# Patient Record
Sex: Female | Born: 1980 | Race: White | Hispanic: No | Marital: Married | Smoking: Former smoker
Health system: Southern US, Community
[De-identification: ages and names within clinical notes are randomized; demographics above are authoritative.]

## PROBLEM LIST (undated history)

## (undated) DIAGNOSIS — N941 Unspecified dyspareunia: Secondary | ICD-10-CM

## (undated) DIAGNOSIS — Z87410 Personal history of cervical dysplasia: Secondary | ICD-10-CM

## (undated) DIAGNOSIS — Z86018 Personal history of other benign neoplasm: Secondary | ICD-10-CM

## (undated) DIAGNOSIS — D649 Anemia, unspecified: Secondary | ICD-10-CM

## (undated) DIAGNOSIS — Z862 Personal history of diseases of the blood and blood-forming organs and certain disorders involving the immune mechanism: Secondary | ICD-10-CM

## (undated) DIAGNOSIS — K429 Umbilical hernia without obstruction or gangrene: Secondary | ICD-10-CM

## (undated) DIAGNOSIS — R102 Pelvic and perineal pain: Secondary | ICD-10-CM

## (undated) DIAGNOSIS — Z8632 Personal history of gestational diabetes: Secondary | ICD-10-CM

## (undated) DIAGNOSIS — G5603 Carpal tunnel syndrome, bilateral upper limbs: Secondary | ICD-10-CM

## (undated) DIAGNOSIS — F909 Attention-deficit hyperactivity disorder, unspecified type: Secondary | ICD-10-CM

## (undated) DIAGNOSIS — M1990 Unspecified osteoarthritis, unspecified site: Secondary | ICD-10-CM

## (undated) HISTORY — PX: WISDOM TOOTH EXTRACTION: SHX21

## (undated) HISTORY — PX: CERVICAL CONE BIOPSY: SUR198

## (undated) HISTORY — PX: ABDOMINAL HYSTERECTOMY: SHX81

## (undated) HISTORY — PX: CERVICAL BIOPSY  W/ LOOP ELECTRODE EXCISION: SUR135

---
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
## 2002-06-23 ENCOUNTER — Other Ambulatory Visit: Admission: RE | Admit: 2002-06-23 | Discharge: 2002-06-23 | Payer: Self-pay | Admitting: Obstetrics and Gynecology

## 2003-06-29 ENCOUNTER — Other Ambulatory Visit: Admission: RE | Admit: 2003-06-29 | Discharge: 2003-06-29 | Payer: Self-pay | Admitting: Obstetrics and Gynecology

## 2004-07-27 ENCOUNTER — Other Ambulatory Visit: Admission: RE | Admit: 2004-07-27 | Discharge: 2004-07-27 | Payer: Self-pay | Admitting: Obstetrics and Gynecology

## 2004-12-14 ENCOUNTER — Other Ambulatory Visit: Admission: RE | Admit: 2004-12-14 | Discharge: 2004-12-14 | Payer: Self-pay | Admitting: Obstetrics and Gynecology

## 2005-05-01 ENCOUNTER — Other Ambulatory Visit: Admission: RE | Admit: 2005-05-01 | Discharge: 2005-05-01 | Payer: Self-pay | Admitting: Obstetrics and Gynecology

## 2005-05-02 ENCOUNTER — Other Ambulatory Visit: Admission: RE | Admit: 2005-05-02 | Discharge: 2005-05-02 | Payer: Self-pay | Admitting: Obstetrics and Gynecology

## 2006-08-07 ENCOUNTER — Ambulatory Visit (HOSPITAL_COMMUNITY): Admission: RE | Admit: 2006-08-07 | Discharge: 2006-08-07 | Payer: Self-pay | Admitting: Obstetrics & Gynecology

## 2006-09-03 ENCOUNTER — Encounter: Admission: RE | Admit: 2006-09-03 | Discharge: 2006-09-03 | Payer: Self-pay | Admitting: Obstetrics & Gynecology

## 2006-11-01 ENCOUNTER — Inpatient Hospital Stay (HOSPITAL_COMMUNITY): Admission: AD | Admit: 2006-11-01 | Discharge: 2006-11-05 | Payer: Self-pay | Admitting: Obstetrics and Gynecology

## 2006-11-02 ENCOUNTER — Encounter (INDEPENDENT_AMBULATORY_CARE_PROVIDER_SITE_OTHER): Payer: Self-pay | Admitting: Obstetrics and Gynecology

## 2010-07-20 ENCOUNTER — Ambulatory Visit: Payer: Self-pay | Admitting: Internal Medicine

## 2010-10-24 NOTE — Op Note (Signed)
Latoya Rivers, Latoya Rivers          ACCOUNT NO.:  [id number]   MEDICAL RECORD NO.:  [id number]          PATIENT TYPE:  INP   LOCATION:  9123                          FACILITY:  [facility]   PHYSICIAN:  Juluis Mire, M.D.   DATE OF BIRTH:  10-13-1980   DATE OF PROCEDURE:  11/02/2006  DATE OF DISCHARGE:                               OPERATIVE REPORT   PREOPERATIVE DIAGNOSES:  1. Uterine pregnancy at term with oligohydramnios.  2. Gestational diabetes.  3. Failure to progress.   POSTOP DIAGNOSES:  1. Uterine pregnancy at term with oligohydramnios.  2. Gestational diabetes.  3. Failure to progress.  4. Uterine fibroid.   OPERATIVE PROCEDURE:  Low transverse cesarean section.   SURGEON:  Juluis Mire, M.D.   ANESTHESIA:  Epidural.   ESTIMATED BLOOD LOSS:  400-500 mL.   PACKS AND DRAINS:  None.   INTRAOPERATIVE BLOOD PLACEMENT AND COMPLICATIONS:  None.   INDICATIONS AS FOLLOWS:  The patient is a 30 year old gravida 2, para 0  female at 39+ weeks presenting to the office on Friday where ultrasound  revealed oligohydramnios.  Estimated fetal weight was 8 pounds 2 ounces.  Pregnancy complicated by gestational diabetes.  Brought in for induction  of labor; was Cytoteced on Friday.  Began Pitocin on Saturday.  Had  spontaneous rupture of membranes with clear fluid.  Progressed to 4-5 cm  of dilatation with arrest with over 4 hours despite adequate uterine  activity as documented by an intrauterine pressure catheter.  In view of  this decision was made to proceed with primary cesarean section.  The  risks were explained, including the risk of infection.  Risk of  hemorrhage that could require transfusion with the risk of AIDS or  hepatitis.  Risk of injury to adjacent organs including bladder, bowel,  or ureters which could require further exploratory surgery.  Risk of  deep venous thrombosis and pulmonary emboli.  The patient expressed  understanding of indications and risks.   PROCEDURE AS FOLLOW:  Patient was taken to the OR and placed in the  supine position with left lateral tilt.  After a satisfactory level of  epidural anesthesia was obtained, the abdomen was prepped out with  Betadine and draped in a sterile field.  A lower transverse skin  incision made with the knife and carried though the subcutaneous tissue.  The anterior rectus fascia entered sharply and incision into the fascia  entered laterally.  The fascia taken off the muscle superiorly and  inferiorly.  The rectus muscles were separated in the midline.  Anterior  perineum was entered sharply.  Incision into the peritoneum was extended  both superiorly and inferiorly.  A low transverse bladder flap was  developed.  A low transverse uterine incision begun with the knife and  extended laterally using Mayo traction.  The infant was in the vertex  presentation; and was delivered with fundal pressure, and the use of the  vacuum extractor.  Infant was a viable female who weighed 7 pounds 7  ounces and Apgars were 9/9.  Umbilical artery pH was 7.31.   Placenta was delivered manually; and sent  for pathological review.  Uterus was then exteriorized.  She had a large fundal fibroid on the  left side measuring approximately 10 cm.  Uterus was closed with locking  suture of #0 chromic using a two-layer closure technique.  We had good  hemostasis and clear urine output.  Both tubes, and ovaries were  unremarkable.  Uterus was then returned to the abdominal cavity.  We  thoroughly irrigated the pelvis and had excellent hemostasis with clear  urine output.  Muscles and peritoneum were closed with a running suture  of 3-0 Vicryl.  Fascia closed with a running suture of #0 PDS.  Skin was  closed with staples and Steri-Strips.  Sponge, instrument, and needle  count was reported as correct by circulating nurse x2.  Foley catheter  remained clear at time of closure.  The patient tolerated the procedure  well; and  returned to recovery room in good condition.      Juluis Mire, M.D.  Electronically Signed     JSM/MEDQ  D:  11/02/2006  T:  11/03/2006  Job:  [id number]

## 2010-10-24 NOTE — H&P (Signed)
Latoya Rivers, Latoya Rivers NO.:  [id number]   MEDICAL RECORD NO.:  [id number]          PATIENT TYPE:  INP   LOCATION:  9164                          [facility]ACILITY:  [facility]   PHYSICIAN:  Juluis Mire, M.D.   DATE OF BIRTH:  12-10-1980   DATE OF ADMISSION:  11/01/2006  DATE OF DISCHARGE:                              HISTORY & PHYSICAL   The patient is a 30 year old gravida 2, para 0, abortus 1 female, last  menstrual period of August 18 and estimated date of confinement of May  24 and estimated gestational age of 39-6/7 weeks.  Her prenatal course  has been complicated by gestational diabetes, controlled with diet.  An  ultrasound was performed today which revealed amniotic fluid index of 7  cm which is the 50th percentile.  Estimated fetal weight was 8 pounds 2  ounces.  Cervix is long and closed, but in view of the oligohydramnios,  the patient will be admitted now for induction of labor.   In terms of allergies, No known drug allergies.   MEDICATION:  Include prenatal vitamins.   For past medical history, family history and social history, please see  prenatal records.   REVIEW OF SYSTEMS:  Noncontributory.   PHYSICAL EXAMINATION:  The patient is afebrile with stable vital signs.  LUNGS:  Are clear.  CARDIOVASCULAR SYSTEM:  Rhythm and rate.  Grade 2/6 systolic ejection  murmur.  No clicks or gallops.  ABDOMINAL EXAM:  Gravid uterus consistent with dates.  PELVIC EXAM:  The cervix is long and closed, vertex presenting.  EXTREMITIES:  Trace edema.  NEUROLOGIC EXAM:  Grossly within normal limits.   IMPRESSION:  1. Intrauterine pregnancy at 39+ weeks with oligohydramnios.  2. Gestational diabetes.   PLAN:  The patient brought in for Cytotec ripening of the cervix.  She  is group B strep negative.  We have discussed induction of labor as well  as the indications.      Juluis Mire, M.D.  Electronically Signed     JSM/MEDQ  D:  11/01/2006  T:  11/01/2006   Job:  [id number]

## 2010-10-26 ENCOUNTER — Other Ambulatory Visit: Payer: Self-pay | Admitting: Obstetrics & Gynecology

## 2010-10-27 NOTE — Discharge Summary (Signed)
Latoya Rivers, Latoya Rivers          ACCOUNT NO.:  [id number]   MEDICAL RECORD NO.:  [id number]          PATIENT TYPE:  INP   LOCATION:  9123                          [facility]ACILITY:  [facility]   PHYSICIAN:  Guy Sandifer. Henderson Cloud, M.D. DATE OF BIRTH:  [birth year]   DATE OF ADMISSION:  11/01/2006  DATE OF DISCHARGE:  11/05/2006                               DISCHARGE SUMMARY   ADMISSION DIAGNOSES:  1. Intrauterine pregnancy at 39+ weeks estimated gestational age.  2. Induction of labor secondary to oligohydramnios and gestational      diabetes.   DISCHARGE DIAGNOSIS:  Status post low transverse cesarean section  secondary to failure to progress, a viable female infant.   PROCEDURE:  Primary low transverse cesarean section.   REASON FOR ADMISSION:  Please see dictated H&P.   HOSPITAL COURSE:  The patient is 30 year old gravida 2, para 0 that was  admitted to [facility] at 39+ weeks estimated  gestational age for two-stage induction of labor.  The patient had been  seen in the office on day prior to admission with noted oligohydramnios.  Estimated fetal weight was 8 pounds 2 ounces.  Pregnancy had also been  complicated by gestational diabetes.  On admission, Cytotec was  administered for augmentation of her labor.  On following morning the  patient was administered Pitocin.  The patient subsequently did have  spontaneous rupture of membranes which revealed clear fluid.  She did  progress to 4-5 cm of dilatation.  However, she arrested labor over the  next 4 hours with no further change in cervix. Adequate uterine activity  was documented by intrauterine pressure catheter.  Decision was made  then to proceed with a primary low transverse cesarean section.  The  patient was then transferred the operating room where epidural was dosed  to an adequate surgical level.  Low transverse incision was made with  delivery of a viable female infant weighing 7 pounds 7 ounces Apgars of 9  at 1 minute  and 9 at 5 minutes.  The patient tolerated procedure well  and taken to the recovery room in stable condition.  On postoperative  day #1 the patient was without complaint.  Vital signs were stable.  She  is afebrile.  Abdomen soft with good return of bowel function.  Fundus  is firm and nontender.  Abdominal dressing was noted to be clean, dry  and intact.  Laboratory findings revealed hemoglobin of 13.3. On  postoperative day #2 the patient was without complaint.  Vital signs  remained stable.  She is afebrile.  Fundus firm and nontender.  Abdominal dressing had been removed revealing an incision that is clean,  dry and intact.  Postoperative day #3 the patient was without complaint.  Vital signs remained stable.  She is afebrile.  Abdomen soft.  Fundus  firm and nontender.  Incision was clean, dry and intact.  Staples  removed.  RhoGAM had been administered. Discharge instructions reviewed  and the patient was later discharged home.   CONDITION ON DISCHARGE:  Good, diet regular as tolerated.   ACTIVITY:  No heavy lifting, no driving x2 weeks, no vaginal  entry.   FOLLOW UP:  Patient to follow up in the office in 1-2 weeks for incision  check.  She is to call for temperature greater than 100 degrees,  persistent nausea, vomiting, heavy vaginal bleeding and/or redness or  drainage from the incisional site.   DISCHARGE MEDICATIONS:  1. Tylox #30 one p.o. every 4-6 hours p.r.n.  2. Motrin 600 mg every 6 hours.  3. Prenatal vitamins one p.o. daily.  4. Colace one p.o. daily p.r.n.      Julio Sicks, N.P.      Guy Sandifer. Henderson Cloud, M.D.  Electronically Signed    CC/MEDQ  D:  12/08/2006  T:  12/08/2006  Job:  [id number]

## 2011-04-13 ENCOUNTER — Other Ambulatory Visit: Payer: Self-pay | Admitting: Obstetrics & Gynecology

## 2011-06-07 ENCOUNTER — Ambulatory Visit: Payer: Self-pay | Admitting: Internal Medicine

## 2011-11-18 ENCOUNTER — Ambulatory Visit: Payer: Self-pay

## 2012-09-09 ENCOUNTER — Ambulatory Visit: Payer: Self-pay | Admitting: Family Medicine

## 2013-01-06 LAB — OB RESULTS CONSOLE HEPATITIS B SURFACE ANTIGEN: Hepatitis B Surface Ag: NEGATIVE

## 2013-01-06 LAB — OB RESULTS CONSOLE ANTIBODY SCREEN: Antibody Screen: NEGATIVE

## 2013-01-06 LAB — OB RESULTS CONSOLE RUBELLA ANTIBODY, IGM: RUBELLA: IMMUNE

## 2013-01-06 LAB — OB RESULTS CONSOLE HIV ANTIBODY (ROUTINE TESTING): HIV: NONREACTIVE

## 2013-01-06 LAB — OB RESULTS CONSOLE ABO/RH: RH TYPE: NEGATIVE

## 2013-01-06 LAB — OB RESULTS CONSOLE RPR: RPR: NONREACTIVE

## 2013-07-07 LAB — OB RESULTS CONSOLE GBS: GBS: POSITIVE

## 2013-07-20 ENCOUNTER — Encounter (HOSPITAL_COMMUNITY): Payer: Self-pay | Admitting: Pharmacist

## 2013-08-02 NOTE — H&P (Signed)
Latoya Rivers is a 33 y.o. female presenting at 41 weeks for repeat cesarean section.  Declines trial of labor.   Prenatal course complicated by gestational diabetes diet controlled and positive group B strept Maternal Medical History:  Reason for admission: For repeat cesarean section  Prenatal complications: Prior cesarean section.  gestationa diabetes  Prenatal Complications - Diabetes: gestational. Diabetes is managed by diet.      OB History   No data available     No past medical history on file. No past surgical history on file. Family History: family history is not on file. Social History:  has no tobacco, alcohol, and drug history on file.   Prenatal Transfer Tool  Maternal Diabetes: gestational diabetes diet comtrolled Genetic Screening: normal Maternal Ultrasounds/Referrals: normal Fetal Ultrasounds or other Referrals:  none Maternal Substance Abuse:  no Significant Maternal Medications:  none Significant Maternal Lab Results:  Positive GBS Other Comments:  none  ROS    There were no vitals taken for this visit. Exam Physical Exam  Constitutional: She is oriented to person, place, and time. She appears well-developed and well-nourished.  HENT:  Head: Normocephalic and atraumatic.  Eyes: Conjunctivae and EOM are normal. Pupils are equal, round, and reactive to light.  Cardiovascular: Normal rate, regular rhythm and normal heart sounds.   Respiratory: Effort normal and breath sounds normal. No respiratory distress.  GI:  Gravid uterus consistent with dates.  Well healed low transverse incision  Genitourinary:  Cervix not checked  Musculoskeletal: Normal range of motion. She exhibits edema.  Neurological: She is alert and oriented to person, place, and time. She has normal reflexes. No cranial nerve deficit.    Prenatal labs: ABO, Rh:   Antibody:   Rubella:   RPR:    HBsAg:    HIV:    GBS:     Assessment/Plan: Intrauterine pregnancy at term for  repeat cesarean section Positive GBS Gestational diabetes diet controlled Risk of cesarean section discussed.  These include:  Risk of infection;  Risk of hemorrhage that could require transfusions with the associated risk of aids or hepatitis;  Excessive bleeding could require hysterectomy;  Risk of injury to adjacent organs including bladder, bowel or ureters;  Risk of DVT's and possible pulmonary embolus.  Patient expresses a understanding of indications and risks.;  Essica Kiker S 08/02/2013, 8:51 AM

## 2013-08-03 ENCOUNTER — Encounter (HOSPITAL_COMMUNITY): Payer: Self-pay

## 2013-08-03 ENCOUNTER — Encounter (HOSPITAL_COMMUNITY)
Admission: RE | Admit: 2013-08-03 | Discharge: 2013-08-03 | Disposition: A | Discharge status: Home / Self Care | Payer: Managed Care, Other (non HMO) | Source: Ambulatory Visit | Attending: Obstetrics and Gynecology | Admitting: Obstetrics and Gynecology

## 2013-08-03 HISTORY — DX: Unspecified osteoarthritis, unspecified site: M19.90

## 2013-08-03 LAB — CBC
HCT: 34.7 % — ABNORMAL LOW (ref 36.0–46.0)
Hemoglobin: 11.6 g/dL — ABNORMAL LOW (ref 12.0–15.0)
MCH: 28.8 pg (ref 26.0–34.0)
MCHC: 33.4 g/dL (ref 30.0–36.0)
MCV: 86.1 fL (ref 78.0–100.0)
PLATELETS: 189 10*3/uL (ref 150–400)
RBC: 4.03 MIL/uL (ref 3.87–5.11)
RDW: 15.1 % (ref 11.5–15.5)
WBC: 11 10*3/uL — ABNORMAL HIGH (ref 4.0–10.5)

## 2013-08-03 LAB — BASIC METABOLIC PANEL
BUN: 8 mg/dL (ref 6–23)
CO2: 22 mEq/L (ref 19–32)
CREATININE: 0.65 mg/dL (ref 0.50–1.10)
Calcium: 9 mg/dL (ref 8.4–10.5)
Chloride: 100 mEq/L (ref 96–112)
Glucose, Bld: 133 mg/dL — ABNORMAL HIGH (ref 70–99)
Potassium: 4.3 mEq/L (ref 3.7–5.3)
Sodium: 136 mEq/L — ABNORMAL LOW (ref 137–147)

## 2013-08-03 LAB — RPR: RPR: NONREACTIVE

## 2013-08-03 NOTE — Patient Instructions (Signed)
   Your procedure is scheduled on: Tuesday, Feb 24  Enter through the [employer] [facility] at:  6 AM Pick up the phone at the desk and dial [phone] and inform us of your arrival.  Please call this number if you have any problems the morning of surgery: [phone]  Remember: Do not eat or drink after midnight: Monday Take these medicines the morning of surgery with a SIP OF WATER:  Do not wear jewelry, make-up, or FINGER nail polish No metal in your hair or on your body. Do not wear lotions, powders, perfumes.  You may wear deodorant.  Do not bring valuables to the hospital. Contacts, dentures or bridgework may not be worn into surgery.  Leave suitcase in the car. After Surgery it may be brought to your room. For patients being admitted to the hospital, checkout time is 11:00am the day of discharge.  Home with husband Herbie [place] cell [phone]

## 2013-08-04 ENCOUNTER — Inpatient Hospital Stay (HOSPITAL_COMMUNITY)
Admission: RE | Admit: 2013-08-04 | Discharge: 2013-08-06 | DRG: 766 | Disposition: A | Discharge status: Home / Self Care | Payer: Managed Care, Other (non HMO) | Source: Ambulatory Visit | Attending: Obstetrics and Gynecology | Admitting: Obstetrics and Gynecology

## 2013-08-04 ENCOUNTER — Encounter (HOSPITAL_COMMUNITY)
Admission: RE | Disposition: A | Discharge status: Home / Self Care | Payer: Self-pay | Source: Ambulatory Visit | Attending: Obstetrics and Gynecology

## 2013-08-04 ENCOUNTER — Inpatient Hospital Stay (HOSPITAL_COMMUNITY): Payer: Managed Care, Other (non HMO) | Admitting: Anesthesiology

## 2013-08-04 ENCOUNTER — Encounter (HOSPITAL_COMMUNITY): Payer: Managed Care, Other (non HMO) | Admitting: Anesthesiology

## 2013-08-04 ENCOUNTER — Encounter (HOSPITAL_COMMUNITY): Payer: Self-pay | Admitting: Anesthesiology

## 2013-08-04 ENCOUNTER — Inpatient Hospital Stay (HOSPITAL_COMMUNITY)
Admission: AD | Admit: 2013-08-04 | Payer: Managed Care, Other (non HMO) | Source: Ambulatory Visit | Admitting: Obstetrics and Gynecology

## 2013-08-04 DIAGNOSIS — Z22330 Carrier of Group B streptococcus: Secondary | ICD-10-CM

## 2013-08-04 DIAGNOSIS — K219 Gastro-esophageal reflux disease without esophagitis: Secondary | ICD-10-CM | POA: Diagnosis present

## 2013-08-04 DIAGNOSIS — O99814 Abnormal glucose complicating childbirth: Secondary | ICD-10-CM | POA: Diagnosis present

## 2013-08-04 DIAGNOSIS — Z87891 Personal history of nicotine dependence: Secondary | ICD-10-CM

## 2013-08-04 DIAGNOSIS — O34219 Maternal care for unspecified type scar from previous cesarean delivery: Principal | ICD-10-CM | POA: Diagnosis present

## 2013-08-04 DIAGNOSIS — O9989 Other specified diseases and conditions complicating pregnancy, childbirth and the puerperium: Secondary | ICD-10-CM

## 2013-08-04 DIAGNOSIS — O99214 Obesity complicating childbirth: Secondary | ICD-10-CM

## 2013-08-04 DIAGNOSIS — Z98891 History of uterine scar from previous surgery: Secondary | ICD-10-CM

## 2013-08-04 DIAGNOSIS — O99892 Other specified diseases and conditions complicating childbirth: Secondary | ICD-10-CM | POA: Diagnosis present

## 2013-08-04 DIAGNOSIS — E669 Obesity, unspecified: Secondary | ICD-10-CM | POA: Diagnosis present

## 2013-08-04 LAB — GLUCOSE, CAPILLARY
GLUCOSE-CAPILLARY: 79 mg/dL (ref 70–99)
Glucose-Capillary: 83 mg/dL (ref 70–99)

## 2013-08-04 LAB — PREPARE RBC (CROSSMATCH)

## 2013-08-04 SURGERY — Surgical Case
Anesthesia: Spinal

## 2013-08-04 MED ORDER — SCOPOLAMINE 1 MG/3DAYS TD PT72
1.0000 | MEDICATED_PATCH | Freq: Once | TRANSDERMAL | Status: DC
  Filled 2013-08-04: qty 1

## 2013-08-04 MED ORDER — DIBUCAINE 1 % RE OINT: 1.0000 "application " | TOPICAL_OINTMENT | RECTAL | Status: DC | PRN

## 2013-08-04 MED ORDER — OXYCODONE-ACETAMINOPHEN 5-325 MG PO TABS
1.0000 | ORAL_TABLET | ORAL | Status: DC | PRN
  Administered 2013-08-05 (×2): 1 via ORAL
  Filled 2013-08-04 (×2): qty 1

## 2013-08-04 MED ORDER — LACTATED RINGERS IV SOLN
Freq: Once | INTRAVENOUS | Status: AC
  Administered 2013-08-04: 07:00:00 via INTRAVENOUS

## 2013-08-04 MED ORDER — KETOROLAC TROMETHAMINE 30 MG/ML IJ SOLN: 30.0000 mg | Freq: Four times a day (QID) | INTRAMUSCULAR | Status: AC | PRN

## 2013-08-04 MED ORDER — SODIUM CHLORIDE 0.9 % IV SOLN: 250.0000 mL | INTRAVENOUS | Status: DC

## 2013-08-04 MED ORDER — ZOLPIDEM TARTRATE 5 MG PO TABS: 5.0000 mg | ORAL_TABLET | Freq: Every evening | ORAL | Status: DC | PRN

## 2013-08-04 MED ORDER — DIPHENHYDRAMINE HCL 25 MG PO CAPS: 25.0000 mg | ORAL_CAPSULE | Freq: Four times a day (QID) | ORAL | Status: DC | PRN

## 2013-08-04 MED ORDER — BISACODYL 10 MG RE SUPP: 10.0000 mg | Freq: Every day | RECTAL | Status: DC | PRN

## 2013-08-04 MED ORDER — NALOXONE HCL 0.4 MG/ML IJ SOLN: 0.4000 mg | INTRAMUSCULAR | Status: DC | PRN

## 2013-08-04 MED ORDER — SODIUM CHLORIDE 0.9 % IJ SOLN: 3.0000 mL | INTRAMUSCULAR | Status: DC | PRN

## 2013-08-04 MED ORDER — MEPERIDINE HCL 25 MG/ML IJ SOLN: 6.2500 mg | INTRAMUSCULAR | Status: DC | PRN

## 2013-08-04 MED ORDER — DIPHENHYDRAMINE HCL 50 MG/ML IJ SOLN: 12.5000 mg | INTRAMUSCULAR | Status: DC | PRN

## 2013-08-04 MED ORDER — CEFAZOLIN SODIUM-DEXTROSE 2-3 GM-% IV SOLR
2.0000 g | INTRAVENOUS | Status: AC
  Administered 2013-08-04: 2 g via INTRAVENOUS

## 2013-08-04 MED ORDER — LACTATED RINGERS IV SOLN
INTRAVENOUS | Status: DC
  Administered 2013-08-04: 15:00:00 via INTRAVENOUS

## 2013-08-04 MED ORDER — ONDANSETRON HCL 4 MG/2ML IJ SOLN
INTRAMUSCULAR | Status: DC | PRN
  Administered 2013-08-04: 4 mg via INTRAVENOUS

## 2013-08-04 MED ORDER — KETOROLAC TROMETHAMINE 30 MG/ML IJ SOLN
30.0000 mg | Freq: Four times a day (QID) | INTRAMUSCULAR | Status: AC | PRN
  Administered 2013-08-04: 30 mg via INTRAMUSCULAR

## 2013-08-04 MED ORDER — FLEET ENEMA 7-19 GM/118ML RE ENEM: 1.0000 | ENEMA | Freq: Every day | RECTAL | Status: DC | PRN

## 2013-08-04 MED ORDER — TETANUS-DIPHTH-ACELL PERTUSSIS 5-2.5-18.5 LF-MCG/0.5 IM SUSP: 0.5000 mL | Freq: Once | INTRAMUSCULAR | Status: DC

## 2013-08-04 MED ORDER — ONDANSETRON HCL 4 MG/2ML IJ SOLN
INTRAMUSCULAR | Status: AC
  Filled 2013-08-04: qty 2

## 2013-08-04 MED ORDER — PRENATAL MULTIVITAMIN CH
1.0000 | ORAL_TABLET | Freq: Every day | ORAL | Status: DC
  Administered 2013-08-05 – 2013-08-06 (×2): 1 via ORAL
  Filled 2013-08-04 (×2): qty 1

## 2013-08-04 MED ORDER — SIMETHICONE 80 MG PO CHEW
80.0000 mg | CHEWABLE_TABLET | ORAL | Status: DC
  Administered 2013-08-05 – 2013-08-06 (×2): 80 mg via ORAL
  Filled 2013-08-04 (×2): qty 1

## 2013-08-04 MED ORDER — NALOXONE HCL 1 MG/ML IJ SOLN
1.0000 ug/kg/h | INTRAVENOUS | Status: DC | PRN
  Filled 2013-08-04: qty 2

## 2013-08-04 MED ORDER — NALBUPHINE HCL 10 MG/ML IJ SOLN: 5.0000 mg | INTRAMUSCULAR | Status: DC | PRN

## 2013-08-04 MED ORDER — LACTATED RINGERS IV SOLN
INTRAVENOUS | Status: DC
  Administered 2013-08-04 (×3): via INTRAVENOUS

## 2013-08-04 MED ORDER — DIPHENHYDRAMINE HCL 25 MG PO CAPS
25.0000 mg | ORAL_CAPSULE | ORAL | Status: DC | PRN
  Administered 2013-08-05: 25 mg via ORAL
  Filled 2013-08-04: qty 1

## 2013-08-04 MED ORDER — IBUPROFEN 600 MG PO TABS
600.0000 mg | ORAL_TABLET | Freq: Four times a day (QID) | ORAL | Status: DC
  Administered 2013-08-04 – 2013-08-06 (×8): 600 mg via ORAL
  Filled 2013-08-04 (×8): qty 1

## 2013-08-04 MED ORDER — SENNOSIDES-DOCUSATE SODIUM 8.6-50 MG PO TABS
2.0000 | ORAL_TABLET | ORAL | Status: DC
  Administered 2013-08-05 – 2013-08-06 (×2): 2 via ORAL
  Filled 2013-08-04 (×2): qty 2

## 2013-08-04 MED ORDER — MORPHINE SULFATE (PF) 0.5 MG/ML IJ SOLN
INTRAMUSCULAR | Status: DC | PRN
  Administered 2013-08-04: .15 mg via INTRATHECAL

## 2013-08-04 MED ORDER — WITCH HAZEL-GLYCERIN EX PADS: 1.0000 "application " | MEDICATED_PAD | CUTANEOUS | Status: DC | PRN

## 2013-08-04 MED ORDER — DIPHENHYDRAMINE HCL 50 MG/ML IJ SOLN: 25.0000 mg | INTRAMUSCULAR | Status: DC | PRN

## 2013-08-04 MED ORDER — SCOPOLAMINE 1 MG/3DAYS TD PT72
MEDICATED_PATCH | TRANSDERMAL | Status: AC
  Filled 2013-08-04: qty 1

## 2013-08-04 MED ORDER — OXYTOCIN 10 UNIT/ML IJ SOLN
40.0000 [IU] | INTRAVENOUS | Status: DC | PRN
  Administered 2013-08-04: 40 [IU] via INTRAVENOUS

## 2013-08-04 MED ORDER — SODIUM CHLORIDE 0.9 % IJ SOLN: 3.0000 mL | Freq: Two times a day (BID) | INTRAMUSCULAR | Status: DC

## 2013-08-04 MED ORDER — SCOPOLAMINE 1 MG/3DAYS TD PT72
1.0000 | MEDICATED_PATCH | Freq: Once | TRANSDERMAL | Status: DC
  Administered 2013-08-04: 1.5 mg via TRANSDERMAL

## 2013-08-04 MED ORDER — KETOROLAC TROMETHAMINE 30 MG/ML IJ SOLN
INTRAMUSCULAR | Status: AC
  Filled 2013-08-04: qty 1

## 2013-08-04 MED ORDER — OXYTOCIN 10 UNIT/ML IJ SOLN
INTRAMUSCULAR | Status: AC
  Filled 2013-08-04: qty 4

## 2013-08-04 MED ORDER — METOCLOPRAMIDE HCL 5 MG/ML IJ SOLN: 10.0000 mg | Freq: Three times a day (TID) | INTRAMUSCULAR | Status: DC | PRN

## 2013-08-04 MED ORDER — SIMETHICONE 80 MG PO CHEW: 80.0000 mg | CHEWABLE_TABLET | ORAL | Status: DC | PRN

## 2013-08-04 MED ORDER — ONDANSETRON HCL 4 MG/2ML IJ SOLN: 4.0000 mg | INTRAMUSCULAR | Status: DC | PRN

## 2013-08-04 MED ORDER — PHENYLEPHRINE 8 MG IN D5W 100 ML (0.08MG/ML) PREMIX OPTIME
INJECTION | INTRAVENOUS | Status: DC | PRN
  Administered 2013-08-04: 60 ug/min via INTRAVENOUS

## 2013-08-04 MED ORDER — LIDOCAINE 1%/NA BICARB 0.1 MEQ INJECTION
INJECTION | INTRAVENOUS | Status: AC
  Filled 2013-08-04: qty 1

## 2013-08-04 MED ORDER — FENTANYL CITRATE 0.05 MG/ML IJ SOLN
INTRAMUSCULAR | Status: DC | PRN
  Administered 2013-08-04: 25 ug via INTRATHECAL

## 2013-08-04 MED ORDER — MORPHINE SULFATE 0.5 MG/ML IJ SOLN
INTRAMUSCULAR | Status: AC
Start: 2013-08-04 — End: 2013-08-04
  Filled 2013-08-04: qty 10

## 2013-08-04 MED ORDER — FENTANYL CITRATE 0.05 MG/ML IJ SOLN
INTRAMUSCULAR | Status: AC
  Filled 2013-08-04: qty 2

## 2013-08-04 MED ORDER — ONDANSETRON HCL 4 MG PO TABS: 4.0000 mg | ORAL_TABLET | ORAL | Status: DC | PRN

## 2013-08-04 MED ORDER — SIMETHICONE 80 MG PO CHEW
80.0000 mg | CHEWABLE_TABLET | Freq: Three times a day (TID) | ORAL | Status: DC
  Administered 2013-08-04 – 2013-08-06 (×5): 80 mg via ORAL
  Filled 2013-08-04 (×6): qty 1

## 2013-08-04 MED ORDER — LANOLIN HYDROUS EX OINT: 1.0000 "application " | TOPICAL_OINTMENT | CUTANEOUS | Status: DC | PRN

## 2013-08-04 MED ORDER — FENTANYL CITRATE 0.05 MG/ML IJ SOLN: 50.0000 ug | Freq: Once | INTRAMUSCULAR | Status: DC

## 2013-08-04 MED ORDER — MENTHOL 3 MG MT LOZG: 1.0000 | LOZENGE | OROMUCOSAL | Status: DC | PRN

## 2013-08-04 MED ORDER — ONDANSETRON HCL 4 MG/2ML IJ SOLN: 4.0000 mg | Freq: Three times a day (TID) | INTRAMUSCULAR | Status: DC | PRN

## 2013-08-04 MED ORDER — SCOPOLAMINE 1 MG/3DAYS TD PT72
MEDICATED_PATCH | TRANSDERMAL | Status: AC
  Administered 2013-08-04: 1.5 mg via TRANSDERMAL
  Filled 2013-08-04: qty 1

## 2013-08-04 MED ORDER — PHENYLEPHRINE 8 MG IN D5W 100 ML (0.08MG/ML) PREMIX OPTIME
INJECTION | INTRAVENOUS | Status: AC
  Filled 2013-08-04: qty 100

## 2013-08-04 MED ORDER — OXYTOCIN 40 UNITS IN LACTATED RINGERS INFUSION - SIMPLE MED: 62.5000 mL/h | INTRAVENOUS | Status: AC

## 2013-08-04 SURGICAL SUPPLY — 33 items
ADH SKN CLS LQ APL DERMABOND (GAUZE/BANDAGES/DRESSINGS) ×1
CLAMP CORD UMBIL (MISCELLANEOUS) IMPLANT
CLOTH BEACON ORANGE TIMEOUT ST (SAFETY) ×3 IMPLANT
DERMABOND ADHESIVE PROPEN (GAUZE/BANDAGES/DRESSINGS) ×2
DERMABOND ADVANCED .7 DNX6 (GAUZE/BANDAGES/DRESSINGS) ×1 IMPLANT
DRAPE LG THREE QUARTER DISP (DRAPES) IMPLANT
DRSG OPSITE POSTOP 4X10 (GAUZE/BANDAGES/DRESSINGS) ×3 IMPLANT
DURAPREP 26ML APPLICATOR (WOUND CARE) ×3 IMPLANT
ELECT REM PT RETURN 9FT ADLT (ELECTROSURGICAL) ×3
ELECTRODE REM PT RTRN 9FT ADLT (ELECTROSURGICAL) ×1 IMPLANT
EXTRACTOR VACUUM M CUP 4 TUBE (SUCTIONS) IMPLANT
EXTRACTOR VACUUM M CUP 4' TUBE (SUCTIONS)
GLOVE BIO SURGEON STRL SZ7 (GLOVE) ×3 IMPLANT
GOWN STRL REUS W/ TWL XL LVL3 (GOWN DISPOSABLE) ×1 IMPLANT
GOWN STRL REUS W/TWL LRG LVL3 (GOWN DISPOSABLE) ×3 IMPLANT
GOWN STRL REUS W/TWL XL LVL3 (GOWN DISPOSABLE) ×3
KIT ABG SYR 3ML LUER SLIP (SYRINGE) ×3 IMPLANT
NDL HYPO 25X5/8 SAFETYGLIDE (NEEDLE) ×1 IMPLANT
NEEDLE HYPO 25X5/8 SAFETYGLIDE (NEEDLE) ×3 IMPLANT
NS IRRIG 1000ML POUR BTL (IV SOLUTION) ×3 IMPLANT
PACK C SECTION WH (CUSTOM PROCEDURE TRAY) ×3 IMPLANT
PAD OB MATERNITY 4.3X12.25 (PERSONAL CARE ITEMS) ×3 IMPLANT
STAPLER VISISTAT 35W (STAPLE) IMPLANT
SUT CHROMIC 0 CTX 36 (SUTURE) ×6 IMPLANT
SUT MON AB 4-0 PS1 27 (SUTURE) ×3 IMPLANT
SUT PDS AB 0 CT 36 (SUTURE) ×3 IMPLANT
SUT PLAIN 0 NONE (SUTURE) IMPLANT
SUT PLAIN 2 0 XLH (SUTURE) IMPLANT
SUT VIC AB 3-0 CT1 27 (SUTURE) ×3
SUT VIC AB 3-0 CT1 TAPERPNT 27 (SUTURE) ×1 IMPLANT
TOWEL OR 17X24 6PK STRL BLUE (TOWEL DISPOSABLE) ×3 IMPLANT
TRAY FOLEY CATH 14FR (SET/KITS/TRAYS/PACK) ×3 IMPLANT
WATER STERILE IRR 1000ML POUR (IV SOLUTION) ×3 IMPLANT

## 2013-08-04 NOTE — Transfer of Care (Signed)
Immediate Anesthesia Transfer of Care Note  Patient: Latoya Rivers  Procedure(s) Performed: Procedure(s) with comments: CESAREAN SECTION (N/A) - Repeat edc 08/09/13  Patient Location: PACU  Anesthesia Type:Spinal  Level of Consciousness: awake, alert  and oriented  Airway & Oxygen Therapy: Patient Spontanous Breathing  Post-op Assessment: Report given to PACU RN and Post -op Vital signs reviewed and stable  Post vital signs: Reviewed and stable  Complications: No apparent anesthesia complications

## 2013-08-04 NOTE — Anesthesia Procedure Notes (Signed)

## 2013-08-04 NOTE — Op Note (Signed)
Preoperative diagnosis: Intrauterine pregnancy at 39 weeks with prior cesarean section desirous of repeat Postoperative diagnosis: The same  procedure: Low transverse cesarean section Surgeon: Arvella Nigh Anesthesia: Spinal Estimated blood loss 350 cc Packs were none Drains include a urethral Foley Complications were none. Her complete history and physical please see dictated note Procedure the patient segment oh are placed the supine position with left lateral tilt. After satisfactory level spinal anesthesia obtained the abdomen was prepped and draped as a sterile field. A prior low transverse skin incision with excised. Incision was then extended to the subcutaneous tissue. The fascia was identified and entered sharply. The incision the fascia was extended laterally the fascia was taken off the muscle superiorly inferiorly. Rectus muscles were separated in the midline. Her name is her sharply incision peritoneum extended both superiorly and inferiorly. A low transverse bladder flap was developed. A low transverse uterine incision was begun with the knife and extended laterally using manual traction. The infant presented in the vertex presentation was delivered elevation head and fundal pressure. The infant was a viable female with Apgars of 8/9. Placenta was delivered manually. He was exteriorized for closure. She did have a serosal fibroid in the left fundal area. In measuring proximally 6 cm.  Tubes and ovaries were unremarkable. Uterus and closed with a running interlocking suture of 0 chromic. We had good hemostasis. Urine output was clear. Uterus was returned to the dominant cavity. Irrigated the pelvis had good hemostasis and clear urine output. Muscles and peritoneum) suture 3-0 Vicryl. Fascia was closed with a running suture of 0 PDS. Care was closed running subcuticular of 4-0 Monocryl and Dermabond. Sponge instrument and needle count report for this correct by the circulating nurse x3. She  tolerated suture was turned recovery in good condition.

## 2013-08-04 NOTE — Lactation Note (Signed)
This note was copied from the chart of Latoya Sunshine Mackowski. Lactation Consultation Note  Patient Name: Latoya Rivers STMHD'Q Date: 08/04/2013 Reason for consult: Initial assessment;Other (Comment) (charting for exclusion) based on mother's choice although she intends to only offer ebm, if possible and will exclusively breastfeed in the beginning.  Mom states she has been able to express some drops of colostrum by hand and with hand pump.  Mom had latching difficulty with her 89 yo son but pumped for a few weeks, then was able to breastfeed for 6 months, although she did supplement with formula after 3 months.  Mom has semi-flat nipples but states she never used a NS and that her nipples evert when stimulated.  Baby has been sleepy at several attempts and at this time, no feeding cues noted.  LC encouraged frequent STS and cue feedings, combined with use of hand pump prior to latch to provide ebm as needed.  LC encouraged review of Baby and Me pp 9, 14 and 20-25 for STS and BF information. LC provided [employer] brochure and reviewed [facility] services and list of community and web site resources.    Maternal Data Formula Feeding for Exclusion: Yes Reason for exclusion: Mother's choice to formula and breast feed on admission Infant to breast within first hour of birth: Yes Has patient been taught Hand Expression?: Yes (mom able to hand express and use hand pump with colostrum readily expressible) Does the patient have breastfeeding experience prior to this delivery?: Yes  Feeding    LATCH Score/Interventions           LATCH scores=5/6 thus far due to baby's sleepiness but mom has attempted and placed STS           Lactation Tools Discussed/Used   STS, hand expression, cue feedings  Consult Status Consult Status: Follow-up Date: 08/05/13 Follow-up type: In-patient    Latoya Rivers [facility] 08/04/2013, 9:01 PM

## 2013-08-04 NOTE — Brief Op Note (Signed)
08/04/2013  8:24 AM  PATIENT:  Latoya Rivers  33 y.o. female  PRE-OPERATIVE DIAGNOSIS:  previous X1  POST-OPERATIVE DIAGNOSIS:  previous X1  PROCEDURE:  Procedure(s) with comments: CESAREAN SECTION (N/A) - Repeat edc 08/09/13  SURGEON:  Surgeon(s) and Role:    * Darlyn Chamber, MD - Primary  PHYSICIAN ASSISTANT:   ASSISTANTS: none   ANESTHESIA:   spinal  EBL:  Total I/O In: 2500 [I.V.:2500] Out: 1000 [Urine:300; Blood:700]  BLOOD ADMINISTERED:none  DRAINS: Urinary Catheter (Foley)   LOCAL MEDICATIONS USED:  NONE  SPECIMEN:  No Specimen  DISPOSITION OF SPECIMEN:  N/A  COUNTS:  YES  TOURNIQUET:  * No tourniquets in log *  DICTATION: .Dragon Dictation  PLAN OF CARE: Admit to inpatient   PATIENT DISPOSITION:  PACU - hemodynamically stable.   Delay start of Pharmacological VTE agent (>24hrs) due to surgical blood loss or risk of bleeding: no

## 2013-08-04 NOTE — Anesthesia Preprocedure Evaluation (Addendum)
Anesthesia Evaluation  Patient identified by MRN, date of birth, ID band Patient awake    Reviewed: Allergy & Precautions, H&P , NPO status , Patient's Chart, lab work & pertinent test results  Airway Mallampati: II TM Distance: >3 FB Neck ROM: Full    Dental no notable dental hx. (+) Teeth Intact   Pulmonary former smoker,  breath sounds clear to auscultation  Pulmonary exam normal       Cardiovascular negative cardio ROS  Rhythm:Regular Rate:Normal     Neuro/Psych  Headaches, negative psych ROS   GI/Hepatic Neg liver ROS, GERD-  Medicated and Controlled,  Endo/Other  diabetesObesity  Renal/GU negative Renal ROS  negative genitourinary   Musculoskeletal negative musculoskeletal ROS (+)   Abdominal (+) + obese,   Peds  Hematology negative hematology ROS (+)   Anesthesia Other Findings   Reproductive/Obstetrics (+) Pregnancy Previous C/Section                          Anesthesia Physical Anesthesia Plan  ASA: II  Anesthesia Plan: Spinal   Post-op Pain Management:    Induction:   Airway Management Planned: Natural Airway  Additional Equipment:   Intra-op Plan:   Post-operative Plan:   Informed Consent: I have reviewed the patients History and Physical, chart, labs and discussed the procedure including the risks, benefits and alternatives for the proposed anesthesia with the patient or authorized representative who has indicated his/her understanding and acceptance.     Plan Discussed with: Anesthesiologist, CRNA and Surgeon  Anesthesia Plan Comments:         Anesthesia Quick Evaluation

## 2013-08-04 NOTE — H&P (Signed)
  History and physical exam unchanged 

## 2013-08-04 NOTE — Anesthesia Postprocedure Evaluation (Signed)
  Anesthesia Post-op Note  Patient: Latoya Rivers  Procedure(s) Performed: Procedure(s) with comments: CESAREAN SECTION (N/A) - Repeat edc 08/09/13  Patient is awake, responsive, moving her legs, and has signs of resolution of her numbness. Pain and nausea are reasonably well controlled. Vital signs are stable and clinically acceptable. Oxygen saturation is clinically acceptable. There are no apparent anesthetic complications at this time. Patient is ready for discharge.

## 2013-08-05 ENCOUNTER — Encounter (HOSPITAL_COMMUNITY): Payer: Self-pay

## 2013-08-05 LAB — CBC
HEMATOCRIT: 30.2 % — AB (ref 36.0–46.0)
Hemoglobin: 10.1 g/dL — ABNORMAL LOW (ref 12.0–15.0)
MCH: 28.7 pg (ref 26.0–34.0)
MCHC: 33.4 g/dL (ref 30.0–36.0)
MCV: 85.8 fL (ref 78.0–100.0)
PLATELETS: 187 10*3/uL (ref 150–400)
RBC: 3.52 MIL/uL — ABNORMAL LOW (ref 3.87–5.11)
RDW: 15.2 % (ref 11.5–15.5)
WBC: 16.8 10*3/uL — ABNORMAL HIGH (ref 4.0–10.5)

## 2013-08-05 MED ORDER — RHO D IMMUNE GLOBULIN 1500 UNIT/2ML IJ SOLN
300.0000 ug | Freq: Once | INTRAMUSCULAR | Status: AC
  Administered 2013-08-05: 300 ug via INTRAMUSCULAR
  Filled 2013-08-05: qty 2

## 2013-08-05 NOTE — Anesthesia Postprocedure Evaluation (Signed)
Anesthesia Post Note  Patient: Latoya Rivers  Procedure(s) Performed: Procedure(s) (LRB): CESAREAN SECTION (N/A)  Anesthesia type: SAB  Patient location: Mother/Baby  Post pain: Pain level controlled  Post assessment: Post-op Vital signs reviewed  Last Vitals:  Filed Vitals:   08/05/13 0445  BP: 105/64  Pulse: 87  Temp: 36.8 C  Resp: 20    Post vital signs: Reviewed  Level of consciousness: awake  Complications: No apparent anesthesia complications

## 2013-08-05 NOTE — Progress Notes (Signed)
Subjective: Postpartum Day 1: Cesarean Delivery Patient reports tolerating PO, + flatus and no problems voiding.    Objective: Vital signs in last 24 hours: Temp:  [97.3 F (36.3 C)-98.8 F (37.1 C)] 98.2 F (36.8 C) (02/25 0445) Pulse Rate:  [65-98] 87 (02/25 0445) Resp:  [16-20] 20 (02/25 0445) BP: (96-114)/(60-77) 105/64 mmHg (02/25 0445) SpO2:  [96 %-100 %] 98 % (02/25 0445) Weight:  [201 lb (91.173 kg)] 201 lb (91.173 kg) (02/24 1100)  Physical Exam:  General: alert and cooperative Lochia: appropriate Uterine Fundus: firm Incision: healing well DVT Evaluation: No evidence of DVT seen on physical exam. Negative Homan's sign. No cords or calf tenderness. No significant calf/ankle edema.   Recent Labs  08/03/13 1503 08/05/13 0614  HGB 11.6* 10.1*  HCT 34.7* 30.2*    Assessment/Plan: Status post Cesarean section. Doing well postoperatively.  Continue current care.  Lariya Kinzie G 08/05/2013, 8:45 AM

## 2013-08-05 NOTE — Addendum Note (Signed)
Addendum created 08/05/13 0801 by Talbot Grumbling, CRNA   Modules edited: Notes Section   Notes Section:  File: [id number]

## 2013-08-05 NOTE — Lactation Note (Signed)
This note was copied from the chart of Latoya Rivers. Lactation Consultation Note Follow up consult:  Baby 8 hours old.  Asked for assistance in waking baby up to breastfed.  Mother was able to hand express a few drops of colostrum and nipple everted slightly.  Attempted latching baby in football and cross cradle but baby was unable to sustain latch.  Mother had difficulty sitting up in bed and she states it it painful to move.  Had mother pump her breasts for 5 minutes., 5 ml of colostrum pumped. Introduced #20 nipple shield and prefilled shield with breastmilk.  Baby sucked intermittently but mother states it hurt.  Introduced #24 nipple shield prefilling with breastmilk.  Baby latched and sucked intermittently for 20 min off and on.  Demonstrated to parents how to syringe feed baby while baby sucks parents finger.  Plan is for mother to post pump for 15 minutes after breastfeeding and give baby back whatever is pumped.  Provided volume guidelines.  Reviewed pump cleaning and milk storage.  Encouraged parents to call for further assistance.   Patient Name: Latoya Rivers [id number] Date: 08/05/2013 Reason for consult: Follow-up assessment   Maternal Data    Feeding Feeding Type: Breast Fed Length of feed: 20 min (with nipple shield)  LATCH Score/Interventions Latch: Repeated attempts needed to sustain latch, nipple held in mouth throughout feeding, stimulation needed to elicit sucking reflex. Intervention(s): Skin to skin Intervention(s): Assist with latch;Breast massage  Audible Swallowing: A few with stimulation  Type of Nipple: Flat Intervention(s): Double electric pump  Comfort (Breast/Nipple): Soft / non-tender     Hold (Positioning): Assistance needed to correctly position infant at breast and maintain latch. Intervention(s): Support Pillows;Position options  LATCH Score: 6  Lactation Tools Discussed/Used Tools: Nipple [place] Fuel;Pump Nipple shield size:  20;24 Breast pump type: Double-Electric Breast Pump Pump Review: Setup, frequency, and cleaning;Milk Storage   Consult Status Consult Status: Follow-up Date: 08/06/13 Follow-up type: In-patient    Vivianne Master [facility] 08/05/2013, 2:47 PM

## 2013-08-06 ENCOUNTER — Encounter (HOSPITAL_COMMUNITY): Payer: Self-pay | Admitting: *Deleted

## 2013-08-06 LAB — RH IG WORKUP (INCLUDES ABO/RH)
ABO/RH(D): A NEG
Fetal Screen: NEGATIVE
Gestational Age(Wks): 39.2
UNIT DIVISION: 0

## 2013-08-06 MED ORDER — OXYCODONE-ACETAMINOPHEN 5-325 MG PO TABS: 1.0000 | ORAL_TABLET | ORAL | Status: DC | PRN

## 2013-08-06 MED ORDER — IBUPROFEN 600 MG PO TABS: 600.0000 mg | ORAL_TABLET | Freq: Four times a day (QID) | ORAL | Status: DC

## 2013-08-06 NOTE — Discharge Summary (Signed)
Obstetric Discharge Summary Reason for Admission: cesarean section Prenatal Procedures: none Intrapartum Procedures: cesarean: low cervical, transverse Postpartum Procedures: none Complications-Operative and Postpartum: none Hemoglobin  Date Value Ref Range Status  08/05/2013 10.1* 12.0 - 15.0 g/dL Final     HCT  Date Value Ref Range Status  08/05/2013 30.2* 36.0 - 46.0 % Final    Physical Exam:  General: alert, cooperative and appears stated age 33: appropriate Uterine Fundus: firm Incision: healing well, no significant drainage, no dehiscence DVT Evaluation: No evidence of DVT seen on physical exam. Negative Homan's sign. No cords or calf tenderness.  Discharge Diagnoses: Term Pregnancy-delivered  Discharge Information: Date: 08/06/2013 Activity: pelvic rest Diet: routine Medications: PNV, Ibuprofen and Percocet Condition: stable Instructions: refer to practice specific booklet Discharge to: home   Newborn Data: Live born female  Birth Weight: 8 lb 8.5 oz (3870 g) APGAR: 8, 9  Home with mother.  Ardian Haberland 08/06/2013, 1:36 PM

## 2013-08-06 NOTE — Progress Notes (Signed)
Patient has requested early discharge.  Meeting all goals and pediatrician and cleared baby for discharge.  Will d/c home.  MM

## 2013-08-06 NOTE — Discharge Instructions (Signed)
Call MD for T>100.4, heavy vaginal bleeding, severe abdominal pain, intractable nausea and/or vomiting, or respiratory distress.  Call office to schedule 1 week postop appointment for incision check.  No driving while taking narcotics.  Pelvic rest and no heavy lifting x 6 weeks.

## 2013-08-06 NOTE — Progress Notes (Signed)
Subjective: Postpartum Day 2: Cesarean Delivery Patient reports incisional pain, tolerating PO, + flatus and no problems voiding.    Objective: Vital signs in last 24 hours: Temp:  [98 F (36.7 C)] 98 F (36.7 C) (02/26 0615) Pulse Rate:  [91-96] 96 (02/26 0615) Resp:  [18-20] 18 (02/26 0615) BP: (119-121)/(78-82) 121/82 mmHg (02/26 0615)  Physical Exam:  General: alert, cooperative and appears stated age Lochia: appropriate Uterine Fundus: firm Incision: healing well, no significant drainage, no dehiscence DVT Evaluation: No evidence of DVT seen on physical exam. Negative Homan's sign. No cords or calf tenderness.   Recent Labs  08/03/13 1503 08/05/13 0614  HGB 11.6* 10.1*  HCT 34.7* 30.2*    Assessment/Plan: Status post Cesarean section. Doing well postoperatively.  Continue current care.  Latoya Rivers 08/06/2013, 10:48 AM

## 2013-08-06 NOTE — Lactation Note (Signed)
This note was copied from the chart of Latoya Rivers. Lactation Consultation Note Follow up  Consult:  Baby Latoya 60 hours old and sleeping.  At 1200 parents gave 37 cc of formula and 2cc of pumped breastmilk to baby.  Reviewed volume guidelines with parents.  Mother states she attempted breastfeeding with the NS two times last night and the baby is latching better.  Encouraged the parents to call for assistance with next feeding to view latch.  Family is mainly formula feeding versus breastfeeding.  Reviewed feeding the baby 8-12 times a day.   Patient Name: Latoya Rivers [id number] Date: 08/06/2013 Reason for consult: Follow-up assessment   Maternal Data    Feeding Feeding Type: Formula Nipple Type: Slow - flow  LATCH Score/Interventions                      Lactation Tools Discussed/Used     Consult Status Consult Status: Complete    Carlye Grippe 08/06/2013, 2:01 PM

## 2013-08-07 LAB — TYPE AND SCREEN
ABO/RH(D): A NEG
Antibody Screen: POSITIVE
DAT, IgG: NEGATIVE
UNIT DIVISION: 0
Unit division: 0

## 2013-10-29 ENCOUNTER — Other Ambulatory Visit: Payer: Self-pay | Admitting: Obstetrics and Gynecology

## 2014-01-22 ENCOUNTER — Ambulatory Visit: Payer: Self-pay | Admitting: Family Medicine

## 2014-01-22 LAB — RAPID STREP-A WITH REFLX: MICRO TEXT REPORT: NEGATIVE

## 2014-01-25 LAB — BETA STREP CULTURE(ARMC)

## 2014-03-12 ENCOUNTER — Encounter (HOSPITAL_BASED_OUTPATIENT_CLINIC_OR_DEPARTMENT_OTHER): Payer: Self-pay | Admitting: *Deleted

## 2014-03-19 ENCOUNTER — Encounter (HOSPITAL_BASED_OUTPATIENT_CLINIC_OR_DEPARTMENT_OTHER)
Admission: RE | Disposition: A | Discharge status: Home / Self Care | Payer: Self-pay | Source: Ambulatory Visit | Attending: Surgery

## 2014-03-19 ENCOUNTER — Encounter (HOSPITAL_BASED_OUTPATIENT_CLINIC_OR_DEPARTMENT_OTHER): Payer: Managed Care, Other (non HMO) | Admitting: Anesthesiology

## 2014-03-19 ENCOUNTER — Ambulatory Visit (HOSPITAL_BASED_OUTPATIENT_CLINIC_OR_DEPARTMENT_OTHER): Payer: Managed Care, Other (non HMO) | Admitting: Anesthesiology

## 2014-03-19 ENCOUNTER — Encounter (HOSPITAL_BASED_OUTPATIENT_CLINIC_OR_DEPARTMENT_OTHER): Payer: Self-pay | Admitting: *Deleted

## 2014-03-19 ENCOUNTER — Ambulatory Visit (HOSPITAL_BASED_OUTPATIENT_CLINIC_OR_DEPARTMENT_OTHER)
Admission: RE | Admit: 2014-03-19 | Discharge: 2014-03-19 | Disposition: A | Discharge status: Home / Self Care | Payer: Managed Care, Other (non HMO) | Source: Ambulatory Visit | Attending: Surgery | Admitting: Surgery

## 2014-03-19 DIAGNOSIS — K429 Umbilical hernia without obstruction or gangrene: Secondary | ICD-10-CM | POA: Diagnosis not present

## 2014-03-19 DIAGNOSIS — Z98891 History of uterine scar from previous surgery: Secondary | ICD-10-CM

## 2014-03-19 DIAGNOSIS — Z87891 Personal history of nicotine dependence: Secondary | ICD-10-CM | POA: Diagnosis not present

## 2014-03-19 HISTORY — PX: UMBILICAL HERNIA REPAIR: SHX196

## 2014-03-19 HISTORY — DX: Umbilical hernia without obstruction or gangrene: K42.9

## 2014-03-19 HISTORY — PX: INSERTION OF MESH: SHX5868

## 2014-03-19 LAB — POCT HEMOGLOBIN-HEMACUE: HEMOGLOBIN: 12.5 g/dL (ref 12.0–15.0)

## 2014-03-19 SURGERY — REPAIR, HERNIA, UMBILICAL, ADULT
Anesthesia: General | Site: Abdomen

## 2014-03-19 MED ORDER — HYDROCODONE-ACETAMINOPHEN 5-325 MG PO TABS: 1.0000 | ORAL_TABLET | Freq: Four times a day (QID) | ORAL | Status: DC | PRN

## 2014-03-19 MED ORDER — FENTANYL CITRATE 0.05 MG/ML IJ SOLN: 50.0000 ug | INTRAMUSCULAR | Status: DC | PRN

## 2014-03-19 MED ORDER — MIDAZOLAM HCL 2 MG/ML PO SYRP: 12.0000 mg | ORAL_SOLUTION | Freq: Once | ORAL | Status: DC | PRN

## 2014-03-19 MED ORDER — FENTANYL CITRATE 0.05 MG/ML IJ SOLN
INTRAMUSCULAR | Status: AC
  Filled 2014-03-19: qty 4

## 2014-03-19 MED ORDER — HYDROMORPHONE HCL 1 MG/ML IJ SOLN
INTRAMUSCULAR | Status: AC
  Filled 2014-03-19: qty 1

## 2014-03-19 MED ORDER — OXYCODONE HCL 5 MG PO TABS
ORAL_TABLET | ORAL | Status: AC
  Filled 2014-03-19: qty 1

## 2014-03-19 MED ORDER — OXYCODONE HCL 5 MG PO TABS
5.0000 mg | ORAL_TABLET | Freq: Once | ORAL | Status: AC | PRN
  Administered 2014-03-19: 5 mg via ORAL

## 2014-03-19 MED ORDER — MIDAZOLAM HCL 2 MG/2ML IJ SOLN: 1.0000 mg | INTRAMUSCULAR | Status: DC | PRN

## 2014-03-19 MED ORDER — LIDOCAINE HCL (CARDIAC) 20 MG/ML IV SOLN
INTRAVENOUS | Status: DC | PRN
  Administered 2014-03-19: 50 mg via INTRAVENOUS

## 2014-03-19 MED ORDER — OXYCODONE HCL 5 MG/5ML PO SOLN: 5.0000 mg | Freq: Once | ORAL | Status: AC | PRN

## 2014-03-19 MED ORDER — PROMETHAZINE HCL 25 MG/ML IJ SOLN: 6.2500 mg | INTRAMUSCULAR | Status: DC | PRN

## 2014-03-19 MED ORDER — SUCCINYLCHOLINE CHLORIDE 20 MG/ML IJ SOLN
INTRAMUSCULAR | Status: DC | PRN
  Administered 2014-03-19: 100 mg via INTRAVENOUS

## 2014-03-19 MED ORDER — HYDROMORPHONE HCL 1 MG/ML IJ SOLN
0.2500 mg | INTRAMUSCULAR | Status: DC | PRN
  Administered 2014-03-19 (×2): 0.5 mg via INTRAVENOUS

## 2014-03-19 MED ORDER — MIDAZOLAM HCL 5 MG/5ML IJ SOLN
INTRAMUSCULAR | Status: DC | PRN
  Administered 2014-03-19: 2 mg via INTRAVENOUS

## 2014-03-19 MED ORDER — CEFAZOLIN SODIUM-DEXTROSE 2-3 GM-% IV SOLR
INTRAVENOUS | Status: AC
  Filled 2014-03-19: qty 50

## 2014-03-19 MED ORDER — MIDAZOLAM HCL 2 MG/2ML IJ SOLN
INTRAMUSCULAR | Status: AC
  Filled 2014-03-19: qty 2

## 2014-03-19 MED ORDER — DEXAMETHASONE SODIUM PHOSPHATE 4 MG/ML IJ SOLN
INTRAMUSCULAR | Status: DC | PRN
  Administered 2014-03-19: 10 mg via INTRAVENOUS

## 2014-03-19 MED ORDER — FENTANYL CITRATE 0.05 MG/ML IJ SOLN
INTRAMUSCULAR | Status: DC | PRN
Start: 2014-03-19 — End: 2014-03-19
  Administered 2014-03-19: 100 ug via INTRAVENOUS

## 2014-03-19 MED ORDER — CEFAZOLIN SODIUM-DEXTROSE 2-3 GM-% IV SOLR
2.0000 g | INTRAVENOUS | Status: AC
  Administered 2014-03-19: 2 g via INTRAVENOUS

## 2014-03-19 MED ORDER — LACTATED RINGERS IV SOLN
INTRAVENOUS | Status: DC
  Administered 2014-03-19: 07:00:00 via INTRAVENOUS

## 2014-03-19 MED ORDER — BUPIVACAINE-EPINEPHRINE (PF) 0.25% -1:200000 IJ SOLN
INTRAMUSCULAR | Status: AC
  Filled 2014-03-19: qty 30

## 2014-03-19 MED ORDER — PROPOFOL 10 MG/ML IV BOLUS
INTRAVENOUS | Status: DC | PRN
  Administered 2014-03-19: 200 mg via INTRAVENOUS

## 2014-03-19 MED ORDER — BUPIVACAINE-EPINEPHRINE 0.25% -1:200000 IJ SOLN
INTRAMUSCULAR | Status: DC | PRN
Start: 2014-03-19 — End: 2014-03-19
  Administered 2014-03-19: 8 mL

## 2014-03-19 SURGICAL SUPPLY — 57 items
ADH SKN CLS APL DERMABOND .7 (GAUZE/BANDAGES/DRESSINGS) ×1
APL SKNCLS STERI-STRIP NONHPOA (GAUZE/BANDAGES/DRESSINGS)
BENZOIN TINCTURE PRP APPL 2/3 (GAUZE/BANDAGES/DRESSINGS) IMPLANT
BLADE CLIPPER SURG (BLADE) IMPLANT
BLADE SURG 10 STRL SS (BLADE) ×1 IMPLANT
BLADE SURG 15 STRL LF DISP TIS (BLADE) ×1 IMPLANT
BLADE SURG 15 STRL SS (BLADE) ×3
CANISTER SUCT 1200ML W/VALVE (MISCELLANEOUS) ×1 IMPLANT
CHLORAPREP W/TINT 26ML (MISCELLANEOUS) ×3 IMPLANT
CLOSURE WOUND 1/2 X4 (GAUZE/BANDAGES/DRESSINGS)
COVER BACK TABLE 60X90IN (DRAPES) ×3 IMPLANT
COVER MAYO STAND STRL (DRAPES) ×3 IMPLANT
DECANTER SPIKE VIAL GLASS SM (MISCELLANEOUS) IMPLANT
DERMABOND ADVANCED (GAUZE/BANDAGES/DRESSINGS) ×2
DERMABOND ADVANCED .7 DNX12 (GAUZE/BANDAGES/DRESSINGS) ×1 IMPLANT
DRAPE LAPAROTOMY TRNSV 102X78 (DRAPE) ×3 IMPLANT
DRAPE UTILITY XL STRL (DRAPES) ×3 IMPLANT
DRSG TEGADERM 4X4.75 (GAUZE/BANDAGES/DRESSINGS) IMPLANT
ELECT COATED BLADE 2.86 ST (ELECTRODE) ×3 IMPLANT
ELECT REM PT RETURN 9FT ADLT (ELECTROSURGICAL) ×3
ELECTRODE REM PT RTRN 9FT ADLT (ELECTROSURGICAL) ×1 IMPLANT
GLOVE BIOGEL PI IND STRL 7.0 (GLOVE) IMPLANT
GLOVE BIOGEL PI IND STRL 8 (GLOVE) ×1 IMPLANT
GLOVE BIOGEL PI INDICATOR 7.0 (GLOVE) ×2
GLOVE BIOGEL PI INDICATOR 8 (GLOVE) ×4
GLOVE ECLIPSE 6.5 STRL STRAW (GLOVE) ×2 IMPLANT
GLOVE ECLIPSE 8.0 STRL XLNG CF (GLOVE) ×5 IMPLANT
GLOVE EXAM NITRILE EXT CUFF MD (GLOVE) ×2 IMPLANT
GOWN STRL REUS W/ TWL LRG LVL3 (GOWN DISPOSABLE) ×2 IMPLANT
GOWN STRL REUS W/TWL LRG LVL3 (GOWN DISPOSABLE) ×9
MESH VENTRALEX ST 1-7/10 CRC S (Mesh General) ×2 IMPLANT
NDL HYPO 25X1 1.5 SAFETY (NEEDLE) ×1 IMPLANT
NEEDLE HYPO 22GX1.5 SAFETY (NEEDLE) IMPLANT
NEEDLE HYPO 25X1 1.5 SAFETY (NEEDLE) ×3 IMPLANT
NS IRRIG 1000ML POUR BTL (IV SOLUTION) IMPLANT
PACK BASIN DAY SURGERY FS (CUSTOM PROCEDURE TRAY) ×3 IMPLANT
PENCIL BUTTON HOLSTER BLD 10FT (ELECTRODE) ×3 IMPLANT
SLEEVE SCD COMPRESS KNEE MED (MISCELLANEOUS) ×3 IMPLANT
SPONGE LAP 4X18 X RAY DECT (DISPOSABLE) ×2 IMPLANT
STAPLER VISISTAT 35W (STAPLE) IMPLANT
STRIP CLOSURE SKIN 1/2X4 (GAUZE/BANDAGES/DRESSINGS) IMPLANT
SUT MON AB 4-0 PC3 18 (SUTURE) ×3 IMPLANT
SUT NOVA NAB DX-16 0-1 5-0 T12 (SUTURE) ×10 IMPLANT
SUT NOVA NAB GS-22 2 0 T19 (SUTURE) IMPLANT
SUT PROLENE 0 CT 1 30 (SUTURE) IMPLANT
SUT SILK 3 0 SH 30 (SUTURE) IMPLANT
SUT VIC AB 2-0 SH 27 (SUTURE) ×3
SUT VIC AB 2-0 SH 27XBRD (SUTURE) ×1 IMPLANT
SUT VIC AB 3-0 SH 27 (SUTURE)
SUT VIC AB 3-0 SH 27X BRD (SUTURE) IMPLANT
SUT VICRYL 3-0 CR8 SH (SUTURE) ×3 IMPLANT
SYR CONTROL 10ML LL (SYRINGE) ×3 IMPLANT
TOWEL OR 17X24 6PK STRL BLUE (TOWEL DISPOSABLE) ×3 IMPLANT
TOWEL OR NON WOVEN STRL DISP B (DISPOSABLE) ×1 IMPLANT
TUBE CONNECTING 20'X1/4 (TUBING)
TUBE CONNECTING 20X1/4 (TUBING) ×1 IMPLANT
YANKAUER SUCT BULB TIP NO VENT (SUCTIONS) ×1 IMPLANT

## 2014-03-19 NOTE — Anesthesia Preprocedure Evaluation (Signed)
Anesthesia Evaluation  Patient identified by MRN, date of birth, ID band Patient awake    History of Anesthesia Complications Negative for: history of anesthetic complications  Airway Mallampati: I  Neck ROM: Full    Dental   Pulmonary neg pulmonary ROS, former smoker,  breath sounds clear to auscultation        Cardiovascular negative cardio ROS  Rhythm:Regular Rate:Normal     Neuro/Psych    GI/Hepatic   Endo/Other    Renal/GU      Musculoskeletal   Abdominal   Peds  Hematology  (+) anemia ,   Anesthesia Other Findings   Reproductive/Obstetrics                           Anesthesia Physical Anesthesia Plan  ASA: I  Anesthesia Plan: General   Post-op Pain Management:    Induction: Intravenous  Airway Management Planned: LMA  Additional Equipment:   Intra-op Plan:   Post-operative Plan: Extubation in OR  Informed Consent: I have reviewed the patients History and Physical, chart, labs and discussed the procedure including the risks, benefits and alternatives for the proposed anesthesia with the patient or authorized representative who has indicated his/her understanding and acceptance.   Dental advisory given  Plan Discussed with: CRNA and Surgeon  Anesthesia Plan Comments:         Anesthesia Quick Evaluation

## 2014-03-19 NOTE — Anesthesia Procedure Notes (Signed)
Procedure Name: Intubation Date/Time: 03/19/2014 7:36 AM Performed by: Lieutenant Diego Pre-anesthesia Checklist: Patient identified, Emergency Drugs available, Suction available and Patient being monitored Patient Re-evaluated:Patient Re-evaluated prior to inductionOxygen Delivery Method: Circle System Utilized Preoxygenation: Pre-oxygenation with 100% oxygen Intubation Type: IV induction Ventilation: Mask ventilation without difficulty Laryngoscope Size: Miller and 2 Grade View: Grade I Tube type: Oral Tube size: 7.0 mm Number of attempts: 1 Airway Equipment and Method: stylet and oral airway Placement Confirmation: ETT inserted through vocal cords under direct vision,  positive ETCO2 and breath sounds checked- equal and bilateral Secured at: 21 cm Tube secured with: Tape Dental Injury: Teeth and Oropharynx as per pre-operative assessment

## 2014-03-19 NOTE — Transfer of Care (Signed)
Immediate Anesthesia Transfer of Care Note  Patient: Latoya Rivers  Procedure(s) Performed: Procedure(s): UMBILICAL HERNIA REPAIR WITH MESH (N/A) INSERTION OF MESH (N/A)  Patient Location: PACU  Anesthesia Type:General  Level of Consciousness: awake  Airway & Oxygen Therapy: Patient Spontanous Breathing and Patient connected to face mask oxygen  Post-op Assessment: Report given to PACU RN and Post -op Vital signs reviewed and stable  Post vital signs: Reviewed and stable  Complications: No apparent anesthesia complications

## 2014-03-19 NOTE — Op Note (Signed)
Latoya Rivers [facility] 10-Oct-1980 [id number] 03/19/2014  Preoperative diagnosis: umbilical hernia reducible  Postoperative diagnosis: same  Procedure: Umbilical Hernia Repair with Ventralex 4.3 cm coated mesh  Surgeon: Erroll Luna, MD, FACS  Anesthesia: General and 0.25 % marcaine with epinephrine    Clinical History and Indications: The risk of hernia repair include bleeding,  Infection,   Recurrence of the hernia,  Mesh use, chronic pain,  Organ injury,  Bowel injury,  Bladder injury,   nerve injury with numbness around the incision,  Death,  and worsening of preexisting  medical problems.  The alternatives to surgery have been discussed as well..  Long term expectations of both operative and non operative treatments have been discussed.   The patient agrees to proceed.  Procedure: The patient was seen in the preoperative area and the plans for the procedure reviewed again. She  had no further questions. I marked the area of the umbilicus as the operative site. She wishes to prodeed.  The patient was taken to the operating room and after satisfactory anesthesia had been obtained the area was clipped as needed, prepped and draped. The timeout was performed.  I used some 0.25 % bupivicaine with epinephrine  anesthesia to help with postoperative pain management. This was infiltrated around the umbilical area and additional infiltrated as I worked.  A curvilinear incision was made on the inferior aspect of the umbilicus. The umbilical skin was elevated off of the hernia sac. The hernia sac was dissected free of the subcutaneous tissues.  This was reduced and the 2 cm fascia edge cleaned up.  4.3 cm Ventalex coated mesh placed in subfascial position. Secured wit 2 O novafil.  Fascia closed over the mesh.  Hemostasis achieved.  Umbilicus secured to fascia with 2 0 vicryl.   Once the repair was complete the incision was closed by using some 3-0 Vicryl subcutaneous and 4-0 Monocryl subcuticular  sutures.  The patient tolerated the procedure well. There were no operative complications. There was minimal blood loss. All counts were correct. She was taken to the PACU in satisfactory condition.  Turner Daniels, MD, FACS 03/19/2014 8:26 AM

## 2014-03-19 NOTE — H&P (Signed)
Latoya Rivers. Castrogiovanni 03/01/2014 11:25 AM Location: [facility] Patient #: [id number] DOB: 1980/09/25 Married / Language: Undefined / Race: Undefined Female  History of Present Illness Latoya Rivers; 03/01/2014 12:56 PM) Patient words: Pt here with possible umbilical hernia (per GYN) pt sent at the request of Arvella Nigh for umbilical hernia.  The patient is a 33 year old female who presents with an umbilical hernia. This hernia is thought to be acquired. The pain is located in the mid-abdomen. There is no radiation. The patient describes the pain as dull. Onset was gradual. There is no known event that preceded symptom onset. The patient describes this as mild and worsening. Symptoms are exacerbated by lifting.   Other Problems Lenoria Farrier; 03/01/2014 11:26 AM) Hemorrhoids  Past Surgical History Lenoria Farrier; 03/01/2014 11:26 AM) Cesarean Section - Multiple  Diagnostic Studies History Lenoria Farrier; 03/01/2014 11:26 AM) Colonoscopy never Mammogram never Pap Smear 1-5 years ago  Allergies Apolonio Schneiders [place]; 03/01/2014 11:26 AM) No Known Drug Allergies09/21/2015  Social History Apolonio Schneiders [place]; 03/01/2014 11:26 AM) Alcohol use Occasional alcohol use. Caffeine use Carbonated beverages. No drug use Tobacco use Former smoker.  Family History Lenoria Farrier; 03/01/2014 11:26 AM) Breast Cancer Family Members In General. Cerebrovascular Accident Family Members In General. Diabetes Mellitus Mother. Hypertension Mother. Prostate Cancer Family Members In General.  Pregnancy / Birth History Lenoria Farrier; 03/01/2014 11:26 AM) Age at menarche 26 years. Contraceptive History Oral contraceptives. Gravida 3 Maternal age 59-25 Para 2 Regular periods  Review of Systems Lenoria Farrier; 03/01/2014 11:26 AM) General Not Present- Appetite Loss, Chills, Fatigue, Fever, Night Sweats, Weight Gain and Weight Loss. Skin Not Present- Change in  Wart/Mole, Dryness, Hives, Jaundice, New Lesions, Non-Healing Wounds, Rash and Ulcer. HEENT Not Present- Earache, Hearing Loss, Hoarseness, Nose Bleed, Oral Ulcers, Ringing in the Ears, Seasonal Allergies, Sinus Pain, Sore Throat, Visual Disturbances, Wears glasses/contact lenses and Yellow Eyes. Respiratory Not Present- Bloody sputum, Chronic Cough, Difficulty Breathing, Snoring and Wheezing. Breast Not Present- Breast Mass, Breast Pain, Nipple Discharge and Skin Changes. Cardiovascular Not Present- Chest Pain, Difficulty Breathing Lying Down, Leg Cramps, Palpitations, Rapid Heart Rate, Shortness of Breath and Swelling of Extremities. Gastrointestinal Present- Abdominal Pain. Not Present- Bloating, Bloody Stool, Change in Bowel Habits, Chronic diarrhea, Constipation, Difficulty Swallowing, Excessive gas, Gets full quickly at meals, Hemorrhoids, Indigestion, Nausea, Rectal Pain and Vomiting. Female Genitourinary Not Present- Frequency, Nocturia, Painful Urination, Pelvic Pain and Urgency. Musculoskeletal Not Present- Back Pain, Joint Pain, Joint Stiffness, Muscle Pain, Muscle Weakness and Swelling of Extremities. Neurological Not Present- Decreased Memory, Fainting, Headaches, Numbness, Seizures, Tingling, Tremor, Trouble walking and Weakness. Psychiatric Not Present- Anxiety, Bipolar, Change in Sleep Pattern, Depression, Fearful and Frequent crying. Endocrine Not Present- Cold Intolerance, Excessive Hunger, Hair Changes, Heat Intolerance, Hot flashes and New Diabetes. Hematology Not Present- Easy Bruising, Excessive bleeding, Gland problems, HIV and Persistent Infections.   Vitals Apolonio Schneiders Breaks; 03/01/2014 11:26 AM) 03/01/2014 11:25 AM Weight: 193.38 lb Height: 62in Body Surface Area: 1.96 m Body Mass Index: 35.37 kg/m Temp.: 62F(Oral)  Pulse: 86 (Regular)  Resp.: 16 (Unlabored)  BP: 122/72 (Sitting, Left Arm, Standard)    Physical Exam (Eain Mullendore A. Shaelyn Decarli Rivers; 03/01/2014  12:54 PM) General Mental Status-Alert. General Appearance-Consistent with stated age. Hydration-Well hydrated. Voice-Normal.  Head and Neck Head-normocephalic, atraumatic with no lesions or palpable masses. Trachea-midline. Thyroid Gland Characteristics - normal size and consistency.  Chest and Lung Exam Chest and lung exam reveals -quiet, even and easy respiratory effort with no use of accessory  muscles, normal resonance, no flatness or dullness, non-tender and normal tactile fremitus and on auscultation, normal breath sounds, no adventitious sounds and normal vocal resonance. Inspection Chest Wall - Normal. Back - normal.  Cardiovascular Cardiovascular examination reveals -on palpation PMI is normal in location and amplitude, no palpable S3 or S4. Normal cardiac borders., normal heart sounds, regular rate and rhythm with no murmurs, carotid auscultation reveals no bruits and normal pedal pulses bilaterally.  Abdomen Inspection Hernias - Umbilical hernia - Reducible.  Neurologic Neurologic evaluation reveals -alert and oriented x 3 with no impairment of recent or remote memory. Mental Status-Normal.  Musculoskeletal Normal Exam - Left-Upper Extremity Strength Normal and Lower Extremity Strength Normal. Normal Exam - Right-Upper Extremity Strength Normal, Lower Extremity Weakness.    Assessment & Plan (Harlym Gehling A. Lyssa Hackley Rivers; 11/23/7090 95:74 PM) UMBILICAL HERNIA (734.0  K42.9) Impression: pt desires repair prior to hysterectomy.. The risk of hernia repair include bleeding, infection, organ injury, bowel injury, bladder injury, nerve injury recurrent hernia, blood clots, worsening of underlying condition, chronic pain, mesh use, open surgery, death, and the need for other operattions. Pt agrees to proceed Current Plans  REPAIR, UMBILICAL HERNIA, REDUCIBLE, IN PATIENT AGE 89 YEARS OR OLDER ([address]) Schedule for Surgery Provided with written educational  materials The risk of hernia repair include bleeding, infection, organ injury, bowel injury, bladder injury, nerve injury recurrent hernia, blood clots, worsening of underlying condition, chronic pain, mesh use, open surgery, death, and the need for other operattions.   Signed by Turner Daniels, Rivers (03/01/2014 12:56 P

## 2014-03-19 NOTE — Anesthesia Postprocedure Evaluation (Signed)
  Anesthesia Post-op Note  Patient: Latoya Rivers  Procedure(s) Performed: Procedure(s): UMBILICAL HERNIA REPAIR WITH MESH (N/A) INSERTION OF MESH (N/A)  Patient Location: PACU  Anesthesia Type:General  Level of Consciousness: awake and alert   Airway and Oxygen Therapy: Patient Spontanous Breathing  Post-op Pain: mild  Post-op Assessment: Post-op Vital signs reviewed  Post-op Vital Signs: stable  Last Vitals:  Filed Vitals:   03/19/14 0930  BP: 124/84  Pulse: 80  Temp:   Resp: 10    Complications: No apparent anesthesia complications

## 2014-03-19 NOTE — Discharge Instructions (Signed)
[facility] _______[facility] ° °UMBILICAL OR INGUINAL HERNIA REPAIR: POST OP INSTRUCTIONS ° °Always review your discharge instruction sheet given to you by the facility where your surgery was performed. °IF YOU HAVE DISABILITY OR FAMILY LEAVE FORMS, YOU MUST BRING THEM TO THE OFFICE FOR PROCESSING.   °DO NOT GIVE THEM TO YOUR DOCTOR. ° °1. A  prescription for pain medication may be given to you upon discharge.  Take your pain medication as prescribed, if needed.  If narcotic pain medicine is not needed, then you may take acetaminophen (Tylenol) or ibuprofen (Advil) as needed. °2. Take your usually prescribed medications unless otherwise directed. °3. If you need a refill on your pain medication, please contact your pharmacy.  They will contact our office to request authorization. Prescriptions will not be filled after 5 pm or on week-ends. °4. You should follow a light diet the first 24 hours after arrival home, such as soup and crackers, etc.  Be sure to include lots of fluids daily.  Resume your normal diet the day after surgery. °5. Most patients will experience some swelling and bruising around the umbilicus or in the groin and scrotum.  Ice packs and reclining will help.  Swelling and bruising can take several days to resolve.  °6. It is common to experience some constipation if taking pain medication after surgery.  Increasing fluid intake and taking a stool softener (such as Colace) will usually help or prevent this problem from occurring.  A mild laxative (Milk of Magnesia or Miralax) should be taken according to package directions if there are no bowel movements after 48 hours. °7. Unless discharge instructions indicate otherwise, you may remove your bandages 24-48 hours after surgery, and you may shower at that time.  You may have steri-strips (small skin tapes) in place directly over the incision.  These strips should be left on the skin for 7-10 days.  If your surgeon used skin glue on the  incision, you may shower in 24 hours.  The glue will flake off over the next 2-3 weeks.  Any sutures or staples will be removed at the office during your follow-up visit. °8. ACTIVITIES:  You may resume regular (light) daily activities beginning the next day--such as daily self-care, walking, climbing stairs--gradually increasing activities as tolerated.  You may have sexual intercourse when it is comfortable.  Refrain from any heavy lifting or straining until approved by your doctor. °a. You may drive when you are no longer taking prescription pain medication, you can comfortably wear a seatbelt, and you can safely maneuver your car and apply brakes. °b. RETURN TO WORK:  __________________________________________________________ °9. You should see your doctor in the office for a follow-up appointment approximately 2-3 weeks after your surgery.  Make sure that you call for this appointment within a day or two after you arrive home to insure a convenient appointment time. °10. OTHER INSTRUCTIONS:  __________________________________________________________________________________________________________________________________________________________________________________________  °WHEN TO CALL YOUR DOCTOR: °1. Fever over 101.0 °2. Inability to urinate °3. Nausea and/or vomiting °4. Extreme swelling or bruising °5. Continued bleeding from incision. °6. Increased pain, redness, or drainage from the incision ° °The clinic staff is available to answer your questions during regular business hours.  Please don’t hesitate to call and ask to speak to one of the nurses for clinical concerns.  If you have a medical emergency, go to the nearest emergency room or call 911.  A surgeon from [facility] is always on call at the hospital ° ° °  [address], [place], [place], [place]  [address] ?  [address], [place], [place]   [address] [phone] ? [phone] ? FAX [phone] Web site:  [email or web address]    Post Anesthesia Home Care Instructions  Activity: Get plenty of rest for the remainder of the day. A responsible adult should stay with you for 24 hours following the procedure.  For the next 24 hours, DO NOT: -Drive a car -Paediatric nurse -Drink alcoholic beverages -Take any medication unless instructed by your physician -Make any legal decisions or sign important papers.  Meals: Start with liquid foods such as gelatin or soup. Progress to regular foods as tolerated. Avoid greasy, spicy, heavy foods. If nausea and/or vomiting occur, drink only clear liquids until the nausea and/or vomiting subsides. Call your physician if vomiting continues.  Special Instructions/Symptoms: Your throat may feel dry or sore from the anesthesia or the breathing tube placed in your throat during surgery. If this causes discomfort, gargle with warm salt water. The discomfort should disappear within 24 hours.   Call your surgeon if you experience:   1.  Fever over 101.0. 2.  Inability to urinate. 3.  Nausea and/or vomiting. 4.  Extreme swelling or bruising at the surgical site. 5.  Continued bleeding from the incision. 6.  Increased pain, redness or drainage from the incision. 7.  Problems related to your pain medication. 8. Any change in color, movement and/or sensation 9. Any problems and/or concernsCall your surgeon if you experience:   1.  Fever over 101.0. 2.  Inability to urinate. 3.  Nausea and/or vomiting. 4.  Extreme swelling or bruising at the surgical site. 5.  Continued bleeding from the incision. 6.  Increased pain, redness or drainage from the incision. 7.  Problems related to your pain medication. 8. Any change in color, movement and/or sensation 9. Any problems and/or concerns

## 2014-03-22 ENCOUNTER — Encounter (HOSPITAL_BASED_OUTPATIENT_CLINIC_OR_DEPARTMENT_OTHER): Payer: Self-pay | Admitting: Surgery

## 2014-04-08 ENCOUNTER — Other Ambulatory Visit: Payer: Self-pay | Admitting: Obstetrics and Gynecology

## 2014-04-09 LAB — CYTOLOGY - PAP

## 2014-04-12 ENCOUNTER — Encounter (HOSPITAL_BASED_OUTPATIENT_CLINIC_OR_DEPARTMENT_OTHER): Payer: Self-pay | Admitting: Surgery

## 2014-05-13 NOTE — Patient Instructions (Addendum)
   Your procedure is scheduled on: Thursday, Dec 10  Enter through the [employer] [facility] at: 6 AM Pick up the phone at the desk and dial [phone] and inform us of your arrival.  Please call this number if you have any problems the morning of surgery: [phone]  Remember: Do not eat or  Drink after midnight: Wednesday Take these medicines the morning of surgery with a SIP OF WATER:  None  Do not wear jewelry, make-up, or FINGER nail polish No metal in your hair or on your body. Do not wear lotions, powders, perfumes.  You may wear deodorant.  Do not bring valuables to the hospital. Contacts, dentures or bridgework may not be worn into surgery.  Leave suitcase in the car. After Surgery it may be brought to your room. For patients being admitted to the hospital, checkout time is 11:00am the day of discharge.  Home with husband Herbie [place]  cell [phone]

## 2014-05-14 NOTE — H&P (Signed)
  Patient name  Latoya Rivers, Latoya Rivers DICTATION#  [id number] CSN# [id number]  [facility], MD 05/14/2014 11:25 AM

## 2014-05-17 ENCOUNTER — Encounter (HOSPITAL_COMMUNITY)
Admission: RE | Admit: 2014-05-17 | Discharge: 2014-05-17 | Disposition: A | Discharge status: Home / Self Care | Payer: Managed Care, Other (non HMO) | Source: Ambulatory Visit | Attending: Obstetrics and Gynecology | Admitting: Obstetrics and Gynecology

## 2014-05-17 ENCOUNTER — Encounter (HOSPITAL_COMMUNITY): Payer: Self-pay

## 2014-05-17 DIAGNOSIS — D259 Leiomyoma of uterus, unspecified: Secondary | ICD-10-CM | POA: Diagnosis not present

## 2014-05-17 DIAGNOSIS — N879 Dysplasia of cervix uteri, unspecified: Secondary | ICD-10-CM | POA: Diagnosis present

## 2014-05-17 DIAGNOSIS — Z87891 Personal history of nicotine dependence: Secondary | ICD-10-CM | POA: Diagnosis not present

## 2014-05-17 HISTORY — DX: Anemia, unspecified: D64.9

## 2014-05-17 LAB — CBC
HCT: 42 % (ref 36.0–46.0)
Hemoglobin: 13.8 g/dL (ref 12.0–15.0)
MCH: 28.3 pg (ref 26.0–34.0)
MCHC: 32.9 g/dL (ref 30.0–36.0)
MCV: 86.2 fL (ref 78.0–100.0)
Platelets: 299 10*3/uL (ref 150–400)
RBC: 4.87 MIL/uL (ref 3.87–5.11)
RDW: 13.9 % (ref 11.5–15.5)
WBC: 10.8 10*3/uL — ABNORMAL HIGH (ref 4.0–10.5)

## 2014-05-18 NOTE — H&P (Signed)
Latoya Rivers, UPPAL NO.:  [id number]  MEDICAL RECORD NO.:  [id number]  LOCATION:                                 FACILITY:  PHYSICIAN:  Darlyn Chamber, M.D.   DATE OF BIRTH:  01-05-1981  DATE OF ADMISSION:  05/20/2014 DATE OF DISCHARGE:                             HISTORY & PHYSICAL   DATE OF SURGERY:  December 9th at [place]:  This patient is a 33 year old gravida 34, para 2, abortus 22 female presents for laparoscopic-assisted vaginal hysterectomy with removal of both fallopian tubes.  The indications are that since age 67, the patient has had problems with recurrent abnormal cervical cytology.  This has lead to multiple cervical procedures.  She has had 5 LEEP of the cervix, one in 2006, another one in 2006, one in 2002, one in 2003, one recently.  She has also had 1 cold knife conization of the cervix in 2012 and cryotherapy in 2002.  Her last LEEP that we did recently did not reveal any dysplastic changes, and her last Pap smear was basically unremarkable.  However, in view of her longstanding history of abnormal cervical cytology, she wants to proceed with definitive therapy.  She also has a uterine fibroid that has been followed.  It measures approximately 6 cm __________.  Some cycles are relatively heavy because of that.  ALLERGIES:  She has no known drug allergies.  MEDICATIONS:  She is on vitamins.  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  Her pregnancies have been complicated by gestational diabetes.  Her blood sugars are normal in between.  PAST SURGICAL HISTORY:  She has had 2 cesarean sections.  She had umbilical hernia repair; and as noted, numerous cervical procedures.  SOCIAL HISTORY:  Reveals minimal alcohol use.  Had been a smoker in the past, does not do that now.  FAMILY HISTORY:  Noncontributory.  REVIEW OF SYSTEMS:   Noncontributory.  PHYSICAL EXAMINATION:  VITAL SIGNS:  The patient is afebrile.  Stable vital signs. HEENT:  The patient is normocephalic.  Pupils are equal, round, reactive to light and accommodation.  Extraocular movements are intact.  Sclerae and conjunctiva clear.  Oropharynx clear. NECK:  Without thyromegaly. BREASTS:  Not examined. LUNGS:  Clear. CARDIOVASCULAR:  Regular rhythm and rate, without murmurs or gallops. No carotid or abdominal bruits. ABDOMEN:  Benign.  No mass, organomegaly, or tenderness.  Well-healed low-transverse incision. PELVIC:  Normal external genitalia.  Vaginal mucosa is clear.  Cervix is unremarkable.  Uterus is enlarged, approximately 8-9 weeks known fibroids.  Adnexa unremarkable. EXTREMITIES:  Trace edema. NEUROLOGIC:  Grossly in normal limits.  IMPRESSION: 1. 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 2. Uterine fibroids.  PLAN:  The patient is to undergo laparoscopic-assisted vaginal hysterectomy, removal of both fallopian tubes.  Potential need for exploratory surgery has been discussed.  This required longer hospitalization.  She did have a subumbilical hernia repair with mesh. So, we are going to have to work our way around that.  Potentially, we could disrupt the mesh.  However, I think we can reapproximate it.  In terms of complications, the risk of infection.  The risk of hemorrhage could require transfusion with the risk of AIDS or hepatitis.  Risk of injury to adjacent organs including bladder, bowel, ureters that could require further exploratory surgery.  Risk of deep venous thrombosis and pulmonary embolus.  We have discussed the option of just continue to watch her conservatively.  She declines and wants to proceed with definitive therapy.  She does understand the risk and other options.     Darlyn Chamber, M.D.     JSM/MEDQ  D:  05/14/2014  T:  05/14/2014  Job:  [id number]

## 2014-05-20 ENCOUNTER — Ambulatory Visit (HOSPITAL_COMMUNITY): Payer: Managed Care, Other (non HMO) | Admitting: Certified Registered Nurse Anesthetist

## 2014-05-20 ENCOUNTER — Encounter (HOSPITAL_COMMUNITY): Payer: Self-pay | Admitting: Emergency Medicine

## 2014-05-20 ENCOUNTER — Observation Stay (HOSPITAL_COMMUNITY)
Admission: RE | Admit: 2014-05-20 | Discharge: 2014-05-21 | Disposition: A | Discharge status: Home / Self Care | Payer: Managed Care, Other (non HMO) | Source: Ambulatory Visit | Attending: Obstetrics and Gynecology | Admitting: Obstetrics and Gynecology

## 2014-05-20 ENCOUNTER — Encounter (HOSPITAL_COMMUNITY)
Admission: RE | Disposition: A | Discharge status: Home / Self Care | Payer: Self-pay | Source: Ambulatory Visit | Attending: Obstetrics and Gynecology

## 2014-05-20 DIAGNOSIS — D259 Leiomyoma of uterus, unspecified: Principal | ICD-10-CM | POA: Insufficient documentation

## 2014-05-20 DIAGNOSIS — Z90710 Acquired absence of both cervix and uterus: Secondary | ICD-10-CM | POA: Diagnosis present

## 2014-05-20 DIAGNOSIS — N879 Dysplasia of cervix uteri, unspecified: Secondary | ICD-10-CM | POA: Insufficient documentation

## 2014-05-20 DIAGNOSIS — Z87891 Personal history of nicotine dependence: Secondary | ICD-10-CM | POA: Insufficient documentation

## 2014-05-20 HISTORY — PX: LAPAROSCOPIC ASSISTED VAGINAL HYSTERECTOMY: SHX5398

## 2014-05-20 LAB — HCG, SERUM, QUALITATIVE: Preg, Serum: NEGATIVE

## 2014-05-20 SURGERY — HYSTERECTOMY, VAGINAL, LAPAROSCOPY-ASSISTED
Anesthesia: General

## 2014-05-20 MED ORDER — HYDROMORPHONE 0.3 MG/ML IV SOLN
INTRAVENOUS | Status: DC
Start: 2014-05-20 — End: 2014-05-21
  Administered 2014-05-20: 0.399 mg via INTRAVENOUS
  Administered 2014-05-20: 0.799 mg via INTRAVENOUS
  Administered 2014-05-20: 0.4 mg via INTRAVENOUS
  Administered 2014-05-20: 11:00:00 via INTRAVENOUS
  Administered 2014-05-21 (×2): 0.4 mg via INTRAVENOUS
  Filled 2014-05-20: qty 25

## 2014-05-20 MED ORDER — HYDROMORPHONE HCL 1 MG/ML IJ SOLN
INTRAMUSCULAR | Status: AC
  Administered 2014-05-20: 0.5 mg via INTRAVENOUS
  Filled 2014-05-20: qty 1

## 2014-05-20 MED ORDER — GLYCOPYRROLATE 0.2 MG/ML IJ SOLN
INTRAMUSCULAR | Status: DC | PRN
  Administered 2014-05-20: 0.3 mg via INTRAVENOUS

## 2014-05-20 MED ORDER — PROPOFOL 10 MG/ML IV EMUL
INTRAVENOUS | Status: AC
  Filled 2014-05-20: qty 20

## 2014-05-20 MED ORDER — INFLUENZA VAC SPLIT QUAD 0.5 ML IM SUSY
0.5000 mL | PREFILLED_SYRINGE | INTRAMUSCULAR | Status: AC
  Administered 2014-05-21: 0.5 mL via INTRAMUSCULAR
  Filled 2014-05-20: qty 0.5

## 2014-05-20 MED ORDER — HYDROMORPHONE HCL 1 MG/ML IJ SOLN
INTRAMUSCULAR | Status: AC
  Filled 2014-05-20: qty 1

## 2014-05-20 MED ORDER — SODIUM CHLORIDE 0.9 % IJ SOLN: 9.0000 mL | INTRAMUSCULAR | Status: DC | PRN

## 2014-05-20 MED ORDER — SCOPOLAMINE 1 MG/3DAYS TD PT72
1.0000 | MEDICATED_PATCH | Freq: Once | TRANSDERMAL | Status: DC
  Administered 2014-05-20: 1.5 mg via TRANSDERMAL

## 2014-05-20 MED ORDER — MENTHOL 3 MG MT LOZG: 1.0000 | LOZENGE | OROMUCOSAL | Status: DC | PRN

## 2014-05-20 MED ORDER — DEXAMETHASONE SODIUM PHOSPHATE 4 MG/ML IJ SOLN
INTRAMUSCULAR | Status: AC
  Filled 2014-05-20: qty 1

## 2014-05-20 MED ORDER — ACETAMINOPHEN 325 MG PO TABS: 650.0000 mg | ORAL_TABLET | ORAL | Status: DC | PRN

## 2014-05-20 MED ORDER — DEXAMETHASONE SODIUM PHOSPHATE 10 MG/ML IJ SOLN
INTRAMUSCULAR | Status: DC | PRN
  Administered 2014-05-20: 4 mg via INTRAVENOUS

## 2014-05-20 MED ORDER — FENTANYL CITRATE 0.05 MG/ML IJ SOLN
INTRAMUSCULAR | Status: DC | PRN
  Administered 2014-05-20: 50 ug via INTRAVENOUS
  Administered 2014-05-20 (×2): 100 ug via INTRAVENOUS

## 2014-05-20 MED ORDER — NEOSTIGMINE METHYLSULFATE 10 MG/10ML IV SOLN
INTRAVENOUS | Status: DC | PRN
  Administered 2014-05-20: 2 mg via INTRAVENOUS

## 2014-05-20 MED ORDER — MEPERIDINE HCL 25 MG/ML IJ SOLN: 6.2500 mg | INTRAMUSCULAR | Status: DC | PRN

## 2014-05-20 MED ORDER — ROCURONIUM BROMIDE 100 MG/10ML IV SOLN
INTRAVENOUS | Status: AC
  Filled 2014-05-20: qty 1

## 2014-05-20 MED ORDER — MIDAZOLAM HCL 2 MG/2ML IJ SOLN
INTRAMUSCULAR | Status: DC | PRN
  Administered 2014-05-20: 2 mg via INTRAVENOUS

## 2014-05-20 MED ORDER — LIDOCAINE HCL (CARDIAC) 20 MG/ML IV SOLN
INTRAVENOUS | Status: AC
  Filled 2014-05-20: qty 5

## 2014-05-20 MED ORDER — PROMETHAZINE HCL 25 MG/ML IJ SOLN: 6.2500 mg | INTRAMUSCULAR | Status: DC | PRN

## 2014-05-20 MED ORDER — PROPOFOL 10 MG/ML IV BOLUS
INTRAVENOUS | Status: DC | PRN
  Administered 2014-05-20: 180 mg via INTRAVENOUS

## 2014-05-20 MED ORDER — SCOPOLAMINE 1 MG/3DAYS TD PT72
MEDICATED_PATCH | TRANSDERMAL | Status: AC
  Administered 2014-05-20: 1.5 mg via TRANSDERMAL
  Filled 2014-05-20: qty 1

## 2014-05-20 MED ORDER — ONDANSETRON HCL 4 MG/2ML IJ SOLN: 4.0000 mg | Freq: Four times a day (QID) | INTRAMUSCULAR | Status: DC | PRN

## 2014-05-20 MED ORDER — PHENYLEPHRINE 40 MCG/ML (10ML) SYRINGE FOR IV PUSH (FOR BLOOD PRESSURE SUPPORT)
PREFILLED_SYRINGE | INTRAVENOUS | Status: AC
  Filled 2014-05-20: qty 5

## 2014-05-20 MED ORDER — HYDROMORPHONE HCL 1 MG/ML IJ SOLN
0.2500 mg | INTRAMUSCULAR | Status: DC | PRN
  Administered 2014-05-20 (×3): 0.5 mg via INTRAVENOUS

## 2014-05-20 MED ORDER — ROCURONIUM BROMIDE 100 MG/10ML IV SOLN
INTRAVENOUS | Status: DC | PRN
  Administered 2014-05-20: 40 mg via INTRAVENOUS
  Administered 2014-05-20: 10 mg via INTRAVENOUS

## 2014-05-20 MED ORDER — LACTATED RINGERS IR SOLN
Status: DC | PRN
  Administered 2014-05-20: 3000 mL

## 2014-05-20 MED ORDER — ONDANSETRON HCL 4 MG/2ML IJ SOLN
INTRAMUSCULAR | Status: AC
  Filled 2014-05-20: qty 2

## 2014-05-20 MED ORDER — ACETAMINOPHEN 160 MG/5ML PO SOLN
ORAL | Status: AC
  Filled 2014-05-20: qty 40.6

## 2014-05-20 MED ORDER — DIPHENHYDRAMINE HCL 50 MG/ML IJ SOLN: 12.5000 mg | Freq: Four times a day (QID) | INTRAMUSCULAR | Status: DC | PRN

## 2014-05-20 MED ORDER — OXYCODONE-ACETAMINOPHEN 5-325 MG PO TABS
1.0000 | ORAL_TABLET | ORAL | Status: DC | PRN
  Administered 2014-05-21: 1 via ORAL
  Filled 2014-05-20: qty 1

## 2014-05-20 MED ORDER — HYDROMORPHONE HCL 1 MG/ML IJ SOLN
INTRAMUSCULAR | Status: DC | PRN
  Administered 2014-05-20 (×2): 0.5 mg via INTRAVENOUS
  Administered 2014-05-20: 1 mg via INTRAVENOUS

## 2014-05-20 MED ORDER — FENTANYL CITRATE 0.05 MG/ML IJ SOLN
INTRAMUSCULAR | Status: AC
  Filled 2014-05-20: qty 5

## 2014-05-20 MED ORDER — LACTATED RINGERS IV SOLN
INTRAVENOUS | Status: DC
  Administered 2014-05-20 (×3): via INTRAVENOUS

## 2014-05-20 MED ORDER — ACETAMINOPHEN 160 MG/5ML PO SOLN
975.0000 mg | Freq: Once | ORAL | Status: AC
  Administered 2014-05-20: 975 mg via ORAL

## 2014-05-20 MED ORDER — LACTATED RINGERS IV SOLN
INTRAVENOUS | Status: DC
  Administered 2014-05-20 – 2014-05-21 (×4): via INTRAVENOUS

## 2014-05-20 MED ORDER — BUPIVACAINE HCL (PF) 0.25 % IJ SOLN
INTRAMUSCULAR | Status: AC
  Filled 2014-05-20: qty 30

## 2014-05-20 MED ORDER — KETOROLAC TROMETHAMINE 30 MG/ML IJ SOLN
INTRAMUSCULAR | Status: DC | PRN
  Administered 2014-05-20: 30 mg via INTRAVENOUS

## 2014-05-20 MED ORDER — NALOXONE HCL 0.4 MG/ML IJ SOLN
0.4000 mg | INTRAMUSCULAR | Status: DC | PRN
Start: 2014-05-20 — End: 2014-05-21

## 2014-05-20 MED ORDER — CEFAZOLIN SODIUM-DEXTROSE 2-3 GM-% IV SOLR
INTRAVENOUS | Status: AC
  Filled 2014-05-20: qty 50

## 2014-05-20 MED ORDER — DIPHENHYDRAMINE HCL 12.5 MG/5ML PO ELIX: 12.5000 mg | ORAL_SOLUTION | Freq: Four times a day (QID) | ORAL | Status: DC | PRN

## 2014-05-20 MED ORDER — KETOROLAC TROMETHAMINE 30 MG/ML IJ SOLN
INTRAMUSCULAR | Status: AC
  Filled 2014-05-20: qty 1

## 2014-05-20 MED ORDER — GLYCOPYRROLATE 0.2 MG/ML IJ SOLN
INTRAMUSCULAR | Status: AC
  Filled 2014-05-20: qty 3

## 2014-05-20 MED ORDER — 0.9 % SODIUM CHLORIDE (POUR BTL) OPTIME
TOPICAL | Status: DC | PRN
  Administered 2014-05-20: 1000 mL

## 2014-05-20 MED ORDER — KETOROLAC TROMETHAMINE 30 MG/ML IJ SOLN: 15.0000 mg | Freq: Once | INTRAMUSCULAR | Status: DC | PRN

## 2014-05-20 MED ORDER — CEFAZOLIN SODIUM-DEXTROSE 2-3 GM-% IV SOLR
2.0000 g | INTRAVENOUS | Status: AC
  Administered 2014-05-20: 2 g via INTRAVENOUS

## 2014-05-20 MED ORDER — ONDANSETRON HCL 4 MG PO TABS: 4.0000 mg | ORAL_TABLET | Freq: Four times a day (QID) | ORAL | Status: DC | PRN

## 2014-05-20 MED ORDER — MIDAZOLAM HCL 2 MG/2ML IJ SOLN
INTRAMUSCULAR | Status: AC
  Filled 2014-05-20: qty 2

## 2014-05-20 MED ORDER — PHENYLEPHRINE HCL 10 MG/ML IJ SOLN
INTRAMUSCULAR | Status: DC | PRN
  Administered 2014-05-20 (×2): 40 ug via INTRAVENOUS

## 2014-05-20 MED ORDER — BUPIVACAINE HCL (PF) 0.25 % IJ SOLN
INTRAMUSCULAR | Status: DC | PRN
  Administered 2014-05-20: 5 mL

## 2014-05-20 MED ORDER — NEOSTIGMINE METHYLSULFATE 10 MG/10ML IV SOLN
INTRAVENOUS | Status: AC
  Filled 2014-05-20: qty 1

## 2014-05-20 MED ORDER — LIDOCAINE HCL (CARDIAC) 20 MG/ML IV SOLN
INTRAVENOUS | Status: DC | PRN
  Administered 2014-05-20: 80 mg via INTRAVENOUS

## 2014-05-20 MED ORDER — ONDANSETRON HCL 4 MG/2ML IJ SOLN
INTRAMUSCULAR | Status: DC | PRN
  Administered 2014-05-20: 4 mg via INTRAVENOUS

## 2014-05-20 SURGICAL SUPPLY — 47 items
BLADE SURG 10 STRL SS (BLADE) ×2 IMPLANT
CABLE HIGH FREQUENCY MONO STRZ (ELECTRODE) ×2 IMPLANT
CATH ROBINSON RED A/P 16FR (CATHETERS) ×3 IMPLANT
CLOSURE WOUND 1/4 X3 (GAUZE/BANDAGES/DRESSINGS)
CLOTH BEACON ORANGE TIMEOUT ST (SAFETY) ×3 IMPLANT
CONT PATH 16OZ SNAP LID 3702 (MISCELLANEOUS) ×3 IMPLANT
COVER BACK TABLE 60X90IN (DRAPES) ×3 IMPLANT
DECANTER SPIKE VIAL GLASS SM (MISCELLANEOUS) ×2 IMPLANT
DRSG COVADERM PLUS 2X2 (GAUZE/BANDAGES/DRESSINGS) ×6 IMPLANT
DRSG OPSITE POSTOP 3X4 (GAUZE/BANDAGES/DRESSINGS) ×2 IMPLANT
DURAPREP 26ML APPLICATOR (WOUND CARE) ×3 IMPLANT
ELECT REM PT RETURN 9FT ADLT (ELECTROSURGICAL) ×3
ELECTRODE REM PT RTRN 9FT ADLT (ELECTROSURGICAL) IMPLANT
EVACUATOR SMOKE 8.L (FILTER) IMPLANT
GLOVE BIO SURGEON STRL SZ7 (GLOVE) ×6 IMPLANT
GLOVE BIOGEL PI IND STRL 6.5 (GLOVE) ×1 IMPLANT
GLOVE BIOGEL PI IND STRL 7.0 (GLOVE) ×2 IMPLANT
GLOVE BIOGEL PI INDICATOR 6.5 (GLOVE) ×2
GLOVE BIOGEL PI INDICATOR 7.0 (GLOVE) ×4
GOWN STRL REUS W/ TWL LRG LVL3 (GOWN DISPOSABLE) ×7 IMPLANT
GOWN STRL REUS W/TWL LRG LVL3 (GOWN DISPOSABLE) ×21
LIQUID BAND (GAUZE/BANDAGES/DRESSINGS) ×3 IMPLANT
NEEDLE INSUFFLATION 120MM (ENDOMECHANICALS) IMPLANT
NS IRRIG 1000ML POUR BTL (IV SOLUTION) ×3 IMPLANT
PACK LAVH (CUSTOM PROCEDURE TRAY) ×3 IMPLANT
PAD TRENDELENBURG OR TABLE (MISCELLANEOUS) ×3 IMPLANT
PROTECTOR NERVE ULNAR (MISCELLANEOUS) ×3 IMPLANT
SEALER TISSUE G2 CVD JAW 45CM (ENDOMECHANICALS) ×3 IMPLANT
SET CYSTO W/LG BORE CLAMP LF (SET/KITS/TRAYS/PACK) IMPLANT
SET IRRIG TUBING LAPAROSCOPIC (IRRIGATION / IRRIGATOR) ×2 IMPLANT
STRIP CLOSURE SKIN 1/4X3 (GAUZE/BANDAGES/DRESSINGS) IMPLANT
SUT MON AB 2-0 CT1 27 (SUTURE) ×6 IMPLANT
SUT VIC AB 0 CT1 18XCR BRD8 (SUTURE) ×2 IMPLANT
SUT VIC AB 0 CT1 27 (SUTURE) ×3
SUT VIC AB 0 CT1 27XBRD ANBCTR (SUTURE) ×1 IMPLANT
SUT VIC AB 0 CT1 36 (SUTURE) ×3 IMPLANT
SUT VIC AB 0 CT1 8-18 (SUTURE) ×6
SUT VICRYL 0 UR6 27IN ABS (SUTURE) IMPLANT
SUT VICRYL 1 TIES 12X18 (SUTURE) ×3 IMPLANT
SUT VICRYL 4-0 PS2 18IN ABS (SUTURE) ×3 IMPLANT
TOWEL OR 17X24 6PK STRL BLUE (TOWEL DISPOSABLE) ×6 IMPLANT
TRAY FOLEY CATH 14FR (SET/KITS/TRAYS/PACK) ×3 IMPLANT
TROCAR BALLN 12MMX100 BLUNT (TROCAR) IMPLANT
TROCAR OPTI TIP 5M 100M (ENDOMECHANICALS) ×3 IMPLANT
TROCAR XCEL DIL TIP R 11M (ENDOMECHANICALS) IMPLANT
WARMER LAPAROSCOPE (MISCELLANEOUS) ×3 IMPLANT
WATER STERILE IRR 1000ML POUR (IV SOLUTION) ×3 IMPLANT

## 2014-05-20 NOTE — Transfer of Care (Signed)
Immediate Anesthesia Transfer of Care Note  Patient: Latoya Rivers  Procedure(s) Performed: Procedure(s): LAPAROSCOPIC ASSISTED VAGINAL HYSTERECTOMY (N/A)  Patient Location: PACU  Anesthesia Type:General  Level of Consciousness: awake, alert , oriented and patient cooperative  Airway & Oxygen Therapy: Patient Spontanous Breathing and Patient connected to nasal cannula oxygen  Post-op Assessment: Report given to PACU RN and Post -op Vital signs reviewed and stable  Post vital signs: Reviewed and stable  Complications: No apparent anesthesia complications

## 2014-05-20 NOTE — Anesthesia Preprocedure Evaluation (Signed)
Anesthesia Evaluation  Patient identified by MRN, date of birth, ID band Patient awake    Reviewed: Allergy & Precautions, H&P , NPO status , Patient's Chart, lab work & pertinent test results  Airway Mallampati: II  TM Distance: >3 FB Neck ROM: full    Dental no notable dental hx. (+) Teeth Intact   Pulmonary former smoker,    Pulmonary exam normal       Cardiovascular negative cardio ROS      Neuro/Psych negative neurological ROS  negative psych ROS   GI/Hepatic negative GI ROS, Neg liver ROS,   Endo/Other  negative endocrine ROS  Renal/GU negative Renal ROS     Musculoskeletal   Abdominal Normal abdominal exam  (+)   Peds  Hematology   Anesthesia Other Findings   Reproductive/Obstetrics negative OB ROS                             Anesthesia Physical Anesthesia Plan  ASA: II  Anesthesia Plan: General   Post-op Pain Management:    Induction: Intravenous  Airway Management Planned: Oral ETT  Additional Equipment:   Intra-op Plan:   Post-operative Plan: Extubation in OR  Informed Consent: I have reviewed the patients History and Physical, chart, labs and discussed the procedure including the risks, benefits and alternatives for the proposed anesthesia with the patient or authorized representative who has indicated his/her understanding and acceptance.   Dental Advisory Given  Plan Discussed with: CRNA and Surgeon  Anesthesia Plan Comments:         Anesthesia Quick Evaluation

## 2014-05-20 NOTE — Op Note (Signed)
NAMEVANDANA, Latoya Rivers NO.:  [id number]  MEDICAL RECORD NO.:  [id number]  LOCATION:  [facility]                          FACILITY:  [facility]  PHYSICIAN:  Darlyn Chamber, M.D.   DATE OF BIRTH:  06-07-1981  DATE OF PROCEDURE:  05/20/2014 DATE OF DISCHARGE:                              OPERATIVE REPORT   PREOPERATIVE DIAGNOSES: 1. Recurrent cervical dysplasia despite numerous procedures. 2. Uterine fibroids.  POSTOPERATIVE DIAGNOSES: 1. Recurrent cervical dysplasia despite numerous procedures. 2. Uterine fibroids.  OPERATIVE PROCEDURE:  Laparoscopically-assisted vaginal hysterectomy with bilateral salpingectomy.  SURGEON:  Darlyn Chamber, M.D.  ASSISTANT:  Daleen Bo. Gaetano Net, M.D.  ANESTHESIA:  General endotracheal.  ESTIMATED BLOOD LOSS:  600 mL.  PACKS:  None.  DRAINS:  Urethral Foley.  INTRAOPERATIVE BLOOD REPLACED:  None.  COMPLICATIONS:  None.  INDICATION:  Dictated in history and physical.  PROCEDURE IN DETAIL:  As follows.  The patient was taken to the OR and placed in supine position.  After a satisfactory level of general endotracheal anesthesia was obtained, the patient was placed in dorsal lithotomy position using the Allen stirrups.  The abdomen, perineum, and vagina were prepped out with Betadine.  Bladder was emptied by in and out catheterization.  The Hulka tenaculum was put in place and secured. The patient was then draped in sterile field.  She had, had a previous subumbilical hernia repaired, we were told that it was more towards the left of the umbilicus.  We therefore made a vertical incision from the umbilicus in the midline.  We extended through the subcutaneous tissue, identified the fascia, and entered it sharply.  No mesh was encountered. The incision in the fascia was extended slightly to the right.  The peritoneum was entered with blunt finger pressure.  We could feel omental adhesions to the periumbilical area.  We were able to  identify a space.  The open laparoscopic trocar was put in place.  The balloon was filled and the abdomen was deflated with carbon dioxide.  Laparoscope was introduced.  We were able to work around the omental adhesions and get into the pelvis.  Uterus enlarged with approximately 6-7 cm fundal fibroid.  We could visualize both tubes and ovaries and the cul-de-sac was clear.  The upper abdomen was obscured by omental adhesions and I could not visualize the appendix.  Next, a 5 mm trocar was put in place in the suprapubic area under direct visualization.  Using the EnSeal, we first went to the right side.  The right utero-ovarian pedicle was cauterized and incised.  The right tube and mesosalpinx were cauterized incised.  The right round ligament was cauterized and incised.  We did cauterize and incise some of the broad ligament.  Next, we went to the left side.  Left utero-ovarian pedicle was cauterized and incised.  Left tube and mesosalpinx were cauterized and incised.  The left round ligament was cauterized and incised.  Some of the broad ligament was divided also using the EnSeal.  In the procedure, we had good hemostasis.  The EnSeal and laparoscope were removed.  The abdomen was deflated with carbon dioxide.  The patient's legs were repositioned. The Hulka tenaculum was removed.  A  weighted speculum was placed in the vaginal vault.  The cervix was grasped with Ardis Hughs tenaculum.  Cul-de- sac was entered sharply.  Both uterosacral ligaments were clamped, cut, and suture ligated with 0-Vicryl.  The reflection of the vaginal mucosa anteriorly was incised using the Bovie.  We dissected the bladder superiorly.  The paracervical tissue was clamped, cut, and suture ligated with 0-Vicryl.  Using the clamp cut and tie technique with suture ligature of 0-Vicryl, the parametrium serially separated from sides of the uterus, we identified the vesicouterine space, and entered it sharply.  Retractor  was put in place to retract the bladder superiorly.  We continued separating the parametrium from the uterus using the clamp cut and tie technique with suture ligatures of 0-Vicryl. At this point in time, we have gotten fairly around the sides; actually on the right side, there was only 1 small pedicle left, this was clamped and cut.  The pedicles were secured with free ties of 0 Vicryl.  We then began morcellating the uterus.  First we excised the lower uterine segment cervix from the uterus.  We continued the morcellation process until we got to the fundal fibroid.  We were able to deliver that spontaneously through the vaginal opening.  At this point in time, we had an arterial Pumper on the right side.  It was clamped with a right- angle and secured with a free tie of 0-Vicryl.  Other areas of bleeding along the vaginal cuff were brought under control with figure-of-eights of 0-Vicryl.  The posterior vaginal cuff was then run with a running locked suture of 0-Vicryl.  We had fairly good hemostasis.  Vaginal mucosa was reapproximated with 2-0 Monocryl.  Foley was placed to straight drain with retrieval of adequate amount of clear fluid.  At this point in time, the legs were repositioned.  The abdomen was reinflated with carbon dioxide.  Laparoscope was reintroduced and also the __________.  We thoroughly irrigated the pelvis, removed all irrigation and clots.  Some oozing was noted from the vaginal cuff that was brought under control with the bipolar.  Both ovaries appeared to be normal.  We first went to the right fallopian tube.  It was elevated. Using the EnSeal, we cauterized and sized the mesenteric attachments of the tube until it was freed.  The tube was then removed through the subumbilical port.  We then went to the left side.  The left ovary was normal.  Left tube was elevated.  Using the EnSeal, the mesenteric attachments of the tubes were cauterized and incised and the left  tube was removed through the subumbilical port.  We then went back in, we had good hemostasis.  Both the tubes were then removed.  Both ovaries were hemostatically intact and the vaginal cuff.  We irrigated the pelvis again removing the irrigation and then de-inflated the abdomen.  We reinflated, no bleeding was noted.  At this point in time, the abdomen was completely de-inflated.  All trocars were removed.  Subumbilical fascia was closed with two figure-of-eights of 0-Vicryl.  Skin was closed with interrupted subcuticulars of 4-0 Vicryl and Dermabond. Suprapubic incision was closed with Dermabond.  The patient was taken out of the dorsal lithotomy position.  Once alert and extubated, transferred to recovery room in good condition.  Sponge, instrument, and needle count was reported as correct by circulating nurse x3.  Foley catheter remained clear at the time of closure.    Darlyn Chamber, M.D.    JSM/MEDQ  D:  05/20/2014  T:  05/20/2014  Job:  [id number]

## 2014-05-20 NOTE — H&P (Signed)
  History and physical exam unchangedHistory and physical exam unchanged

## 2014-05-20 NOTE — Brief Op Note (Signed)
05/20/2014  9:32 AM  PATIENT:  Latoya Rivers  33 y.o. female  PRE-OPERATIVE DIAGNOSIS:  persistant cervical dysplasia  POST-OPERATIVE DIAGNOSIS:  persistant cervical dysplasia  PROCEDURE:  Procedure(s): LAPAROSCOPIC ASSISTED VAGINAL HYSTERECTOMY (N/A)  SURGEON:  Surgeon(s) and Role:    * Darlyn Chamber, MD - Primary  PHYSICIAN ASSISTANT:   ASSISTANTS: tombliin    ANESTHESIA:   local and general  EBL:  Total I/O In: 2200 [I.V.:2200] Out: 500 [Urine:50; Blood:450]  BLOOD ADMINISTERED:none  DRAINS: Urinary Catheter (Foley)   LOCAL MEDICATIONS USED:  MARCAINE     SPECIMEN:  Source of Specimen:  uterus and both fallopian tubes  DISPOSITION OF SPECIMEN:  PATHOLOGY  COUNTS:  YES  TOURNIQUET:  * No tourniquets in log *  DICTATION: .Other Dictation: Dictation Number [id number]  PLAN OF CARE: Admit for overnight observation  PATIENT DISPOSITION:  PACU - hemodynamically stable.   Delay start of Pharmacological VTE agent (>24hrs) due to surgical blood loss or risk of bleeding: no

## 2014-05-20 NOTE — Anesthesia Postprocedure Evaluation (Signed)
  Anesthesia Post-op Note  Patient: Latoya Rivers  Procedure(s) Performed: Procedure(s): LAPAROSCOPIC ASSISTED VAGINAL HYSTERECTOMY (N/A)  Patient Location: [facility]  Anesthesia Type:General  Level of Consciousness: awake  Airway and Oxygen Therapy: Patient Spontanous Breathing  Post-op Pain: mild  Post-op Assessment: Patient's Cardiovascular Status Stable and Respiratory Function Stable  Post-op Vital Signs: stable  Last Vitals:  Filed Vitals:   05/20/14 1415  BP: 122/67  Pulse: 91  Temp: 36.6 C  Resp: 16    Complications: No apparent anesthesia complications

## 2014-05-20 NOTE — Progress Notes (Signed)
Patient ID: Latoya Rivers, female   DOB: 1980-11-25, 33 y.o.   MRN: [id number] AF VSS ABD SOFT INC CLEAR GOOD UO  NO HEAVY BLEEDING

## 2014-05-20 NOTE — Anesthesia Procedure Notes (Signed)
Procedure Name: Intubation Date/Time: 05/20/2014 7:24 AM Performed by: Raenette Rover Pre-anesthesia Checklist: Patient identified, Emergency Drugs available, Suction available and Patient being monitored Patient Re-evaluated:Patient Re-evaluated prior to inductionOxygen Delivery Method: Circle system utilized Preoxygenation: Pre-oxygenation with 100% oxygen Intubation Type: IV induction Ventilation: Mask ventilation without difficulty Laryngoscope Size: Mac and 3 Grade View: Grade I Tube type: Oral Tube size: 7.0 mm Number of attempts: 1 Airway Equipment and Method: Stylet and Bite block Placement Confirmation: ETT inserted through vocal cords under direct vision,  positive ETCO2,  CO2 detector and breath sounds checked- equal and bilateral Secured at: 22 cm Tube secured with: Tape Dental Injury: Teeth and Oropharynx as per pre-operative assessment

## 2014-05-21 DIAGNOSIS — D259 Leiomyoma of uterus, unspecified: Secondary | ICD-10-CM | POA: Diagnosis not present

## 2014-05-21 LAB — CBC
HEMATOCRIT: 32.9 % — AB (ref 36.0–46.0)
Hemoglobin: 10.9 g/dL — ABNORMAL LOW (ref 12.0–15.0)
MCH: 28.8 pg (ref 26.0–34.0)
MCHC: 33.1 g/dL (ref 30.0–36.0)
MCV: 86.8 fL (ref 78.0–100.0)
Platelets: 255 10*3/uL (ref 150–400)
RBC: 3.79 MIL/uL — AB (ref 3.87–5.11)
RDW: 13.7 % (ref 11.5–15.5)
WBC: 13.2 10*3/uL — AB (ref 4.0–10.5)

## 2014-05-21 MED ORDER — OXYCODONE-ACETAMINOPHEN 7.5-325 MG PO TABS: 1.0000 | ORAL_TABLET | ORAL | Status: DC | PRN

## 2014-05-21 NOTE — Progress Notes (Signed)
UR chart review completed.  

## 2014-05-21 NOTE — Progress Notes (Signed)
1 Day Post-Op Procedure(s) (LRB): LAPAROSCOPIC ASSISTED VAGINAL HYSTERECTOMY (N/A)  Subjective: Patient reports tolerating PO.    Objective: I have reviewed patient's vital signs and labs.  General: alert GI: soft, non-tender; bowel sounds normal; no masses,  no organomegaly and incision: clean Vaginal Bleeding: none  Assessment: s/p Procedure(s): LAPAROSCOPIC ASSISTED VAGINAL HYSTERECTOMY (N/A): stable  Plan: Discharge home  LOS: 1 day    Delbert Vu S 05/21/2014, 7:54 AM

## 2014-05-21 NOTE — Progress Notes (Signed)
Pt discharged to home with husband.  Condition stable. Pt ambulated to car with Santiago Bur, RN.  No equipment for home ordered at discharge.

## 2014-05-21 NOTE — Plan of Care (Signed)
Problem: Phase I Progression Outcomes Goal: Pain controlled with appropriate interventions Outcome: Completed/Met Date Met:  05/21/14 Goal: Admission history reviewed Outcome: Completed/Met Date Met:  05/21/14 Goal: Dangle/OOB as tolerated per MD order Outcome: Completed/Met Date Met:  05/21/14 Goal: VS, stable, temp < 100.4 degrees F Outcome: Completed/Met Date Met:  05/21/14 Goal: I & O every 4 hrs or as ordered Outcome: Completed/Met Date Met:  05/21/14 Goal: IS, TCDB as ordered Outcome: Completed/Met Date Met:  05/21/14

## 2014-05-21 NOTE — Discharge Summary (Signed)
[name], Latoya Rivers NO.:  [id number]  MEDICAL RECORD NO.:  [id number]  LOCATION:  [id number]                          [facility]ILITY:  [facility]  PHYSICIAN:  Darlyn Chamber, M.D.   DATE OF BIRTH:  28-Oct-1980  DATE OF ADMISSION:  05/20/2014 DATE OF DISCHARGE:  05/21/2014                              DISCHARGE SUMMARY   ADMITTING DIAGNOSES:  Recurrent and persistent cervical dysplasia. Uterine fibroids.  DISCHARGE DIAGNOSES:  Recurrent and persistent cervical dysplasia. Uterine fibroids.  OPERATIVE PROCEDURE:  Laparoscopic-assisted vaginal hysterectomy with bilateral salpingectomy.  For complete History and Physical, please see dictated note.  COURSE IN THE HOSPITAL:  The patient underwent above-noted surgery.  She did well postoperatively.  Her postop hemoglobin was 10.9.  She was discharged home on her first postop day.  At that time, she was afebrile with stable vital signs.  Abdomen was soft.  Bowel sounds were active. All incisions were clear.  She was having minimal vaginal bleeding.  In terms of complications, none were encountered while stay in the hospital.  The patient was discharged to home in stable condition.  DISPOSITION:  She is to avoid heavy lifting, vaginal entrance, or driving of her car.  She is to call should there be any elevation of her temperature.  Nausea, vomiting should be reported.  Active bleeding should be reported.  Excessive pain should be reported.  Instructed signs and symptoms of deep venous thrombosis and pulmonary embolus. Discharged home on Percocet, as needed for pain.  PLAN:  Follow up in the office in 1 week.     Darlyn Chamber, M.D.     JSM/MEDQ  D:  05/21/2014  T:  05/21/2014  Job:  [id number]

## 2014-05-21 NOTE — Discharge Summary (Signed)
  Patient name Latoya Rivers, [place] DICTATION# [id number] CSN# [id number]  Darlyn Chamber, MD 05/21/2014 7:57 AM

## 2014-05-22 ENCOUNTER — Encounter (HOSPITAL_COMMUNITY): Payer: Self-pay | Admitting: Obstetrics and Gynecology

## 2014-05-29 NOTE — Anesthesia Postprocedure Evaluation (Signed)
  Anesthesia Post-op Note  Patient: Latoya Rivers  Procedure(s) Performed: Procedure(s): LAPAROSCOPIC ASSISTED VAGINAL HYSTERECTOMY (N/A) Patient is awake and responsive. Pain and nausea are reasonably well controlled. Vital signs are stable and clinically acceptable. Oxygen saturation is clinically acceptable. There are no apparent anesthetic complications at this time. Patient is ready for discharge.

## 2014-06-01 ENCOUNTER — Inpatient Hospital Stay (HOSPITAL_COMMUNITY)
Admission: AD | Admit: 2014-06-01 | Discharge: 2014-06-01 | Disposition: A | Discharge status: Home / Self Care | Payer: Managed Care, Other (non HMO) | Source: Ambulatory Visit | Attending: Obstetrics and Gynecology | Admitting: Obstetrics and Gynecology

## 2014-06-01 ENCOUNTER — Encounter (HOSPITAL_COMMUNITY): Payer: Self-pay | Admitting: *Deleted

## 2014-06-01 DIAGNOSIS — M545 Low back pain: Secondary | ICD-10-CM | POA: Insufficient documentation

## 2014-06-01 DIAGNOSIS — Z87891 Personal history of nicotine dependence: Secondary | ICD-10-CM | POA: Diagnosis not present

## 2014-06-01 DIAGNOSIS — N939 Abnormal uterine and vaginal bleeding, unspecified: Secondary | ICD-10-CM | POA: Diagnosis present

## 2014-06-01 DIAGNOSIS — N99821 Postprocedural hemorrhage and hematoma of a genitourinary system organ or structure following other procedure: Secondary | ICD-10-CM | POA: Insufficient documentation

## 2014-06-01 DIAGNOSIS — IMO0002 Reserved for concepts with insufficient information to code with codable children: Secondary | ICD-10-CM

## 2014-06-01 DIAGNOSIS — D72829 Elevated white blood cell count, unspecified: Secondary | ICD-10-CM | POA: Diagnosis not present

## 2014-06-01 LAB — URINALYSIS, ROUTINE W REFLEX MICROSCOPIC
BILIRUBIN URINE: NEGATIVE
Glucose, UA: NEGATIVE mg/dL
Ketones, ur: NEGATIVE mg/dL
NITRITE: NEGATIVE
PH: 6 (ref 5.0–8.0)
Protein, ur: NEGATIVE mg/dL
SPECIFIC GRAVITY, URINE: 1.015 (ref 1.005–1.030)
Urobilinogen, UA: 0.2 mg/dL (ref 0.0–1.0)

## 2014-06-01 LAB — CBC
HCT: 32.5 % — ABNORMAL LOW (ref 36.0–46.0)
HEMOGLOBIN: 10.5 g/dL — AB (ref 12.0–15.0)
MCH: 27.1 pg (ref 26.0–34.0)
MCHC: 32.3 g/dL (ref 30.0–36.0)
MCV: 84 fL (ref 78.0–100.0)
PLATELETS: 415 10*3/uL — AB (ref 150–400)
RBC: 3.87 MIL/uL (ref 3.87–5.11)
RDW: 13.8 % (ref 11.5–15.5)
WBC: 14.7 10*3/uL — AB (ref 4.0–10.5)

## 2014-06-01 LAB — URINE MICROSCOPIC-ADD ON

## 2014-06-01 NOTE — Discharge Instructions (Signed)
Hematoma °A hematoma is a collection of blood. The collection of blood can turn into a hard, painful lump under the skin. Your skin may turn blue or yellow if the hematoma is close to the surface of the skin. Most hematomas get better in a few days to weeks. Some hematomas are serious and need medical care. Hematomas can be very small or very big. °HOME CARE °· Apply ice to the injured area: °¨ Put ice in a plastic bag. °¨ Place a towel between your skin and the bag. °¨ Leave the ice on for 20 minutes, 2-3 times a day for the first 1 to 2 days. °· After the first 2 days, switch to using warm packs on the injured area. °· Raise (elevate) the injured area to lessen pain and puffiness (swelling). You may also wrap the area with an elastic bandage. Make sure the bandage is not wrapped too tight. °· If you have a painful hematoma on your leg or foot, you may use crutches for a couple days. °· Only take medicines as told by your doctor. °GET HELP RIGHT AWAY IF:  °· Your pain gets worse. °· Your pain is not controlled with medicine. °· You have a fever. °· Your puffiness gets worse. °· Your skin turns more blue or yellow. °· Your skin over the hematoma breaks or starts bleeding. °· Your hematoma is in your chest or belly (abdomen) and you are short of breath, feel weak, or have a change in consciousness. °· Your hematoma is on your scalp and you have a headache that gets worse or a change in alertness or consciousness. °MAKE SURE YOU:  °· Understand these instructions. °· Will watch your condition. °· Will get help right away if you are not doing well or get worse. °Document Released: 07/05/2004 Document Revised: 01/28/2013 Document Reviewed: 11/05/2012 °[employer]® Patient Information ©2015 [employer]. This information is not intended to replace advice given to you by your health care provider. Make sure you discuss any questions you have with your health care provider. ° °

## 2014-06-01 NOTE — MAU Provider Note (Signed)
History     CSN: [id number]  Arrival date and time: 06/01/14 1547   None     Chief Complaint  Patient presents with  . Vaginal Bleeding   HPI   Ms. Latoya Rivers is a 33 y.o. female [id number] who presents with vaginal bleeding. She recently had a Laparoscopic-assisted vaginal hysterectomy with bilateral salpingectomy on 12/10. She has been spotting off and on since the surgery; she had not had to wear a pad up until yesterday when she noticed her bleeding had gotten heavier. She was siting on the floor and felt the blood coming from her vagina. She is having lower back pain since Sunday. She had taken 2 ibuprofen this morning; that helped.  She completed a course of antibiotics today and was placed on them by Dr. Radene Knee for post surgical fever.   OB History    Gravida Para Term Preterm AB TAB SAB Ectopic Multiple Living   3 2 2  1  1   2       Past Medical History  Diagnosis Date  . Umbilical hernia [id number]/6503  . Arthritis     right shoulder, no med  . Anemia     Past Surgical History  Procedure Laterality Date  . Cesarean section  11/02/2006  . Wisdom tooth extraction    . Cesarean section N/A 08/04/2013    Procedure: CESAREAN SECTION;  Surgeon: Darlyn Chamber, MD;  Location: [facility];  Service: Obstetrics;  Laterality: N/A;  Repeat edc 08/09/13  . Cervical cone biopsy    . Umbilical hernia repair N/A 03/19/2014    Procedure: UMBILICAL HERNIA REPAIR WITH MESH;  Surgeon: Erroll Luna, MD;  Location: [facility];  Service: General;  Laterality: N/A;  . Insertion of mesh N/A 03/19/2014    Procedure: INSERTION OF MESH;  Surgeon: Erroll Luna, MD;  Location: [place];  Service: General;  Laterality: N/A;  . Laparoscopic assisted vaginal hysterectomy N/A 05/20/2014    Procedure: LAPAROSCOPIC ASSISTED VAGINAL HYSTERECTOMY;  Surgeon: Darlyn Chamber, MD;  Location: [facility];  Service: Gynecology;  Laterality: N/A;  . Abdominal hysterectomy       History reviewed. No pertinent family history.  History  Substance Use Topics  . Smoking status: Former Smoker -- 0.10 packs/day for 2 years    Types: Cigarettes    Quit date: 06/11/2000  . Smokeless tobacco: Never Used  . Alcohol Use: No     Comment: rarely - none since 2013    Allergies: No Known Allergies  Prescriptions prior to admission  Medication Sig Dispense Refill Last Dose  . acetaminophen (TYLENOL) 500 MG tablet Take 1,000 mg by mouth every 6 (six) hours as needed for headache.   Past Month at Unknown time  . docusate sodium (COLACE) 100 MG capsule Take 100 mg by mouth daily as needed.    05/31/2014 at Unknown time  . ibuprofen (ADVIL,MOTRIN) 200 MG tablet Take 600 mg by mouth every 6 (six) hours as needed for headache.    06/01/2014 at Unknown time  . oxyCODONE-acetaminophen (PERCOCET) 7.5-325 MG per tablet Take 1 tablet by mouth every 4 (four) hours as needed for pain. 30 tablet 0 Past Week at Unknown time  . HYDROcodone-acetaminophen (NORCO) 5-325 MG per tablet Take 1-2 tablets by mouth every 6 (six) hours as needed for moderate pain. (Patient not taking: Reported on 05/11/2014) 30 tablet 0 Not Taking at Unknown time   Results for orders placed or performed during the hospital encounter  of 06/01/14 (from the past 48 hour(s))  Urinalysis, Routine w reflex microscopic     Status: Abnormal   Collection Time: 06/01/14  4:10 PM  Result Value Ref Range   Color, Urine YELLOW YELLOW   APPearance CLEAR CLEAR   Specific Gravity, Urine 1.015 1.005 - 1.030   pH 6.0 5.0 - 8.0   Glucose, UA NEGATIVE NEGATIVE mg/dL   Hgb urine dipstick LARGE (A) NEGATIVE   Bilirubin Urine NEGATIVE NEGATIVE   Ketones, ur NEGATIVE NEGATIVE mg/dL   Protein, ur NEGATIVE NEGATIVE mg/dL   Urobilinogen, UA 0.2 0.0 - 1.0 mg/dL   Nitrite NEGATIVE NEGATIVE   Leukocytes, UA LARGE (A) NEGATIVE  Urine microscopic-add on     Status: Abnormal   Collection Time: 06/01/14  4:10 PM  Result Value Ref Range    Squamous Epithelial / LPF FEW (A) RARE   WBC, UA 21-50 <3 WBC/hpf   RBC / HPF 11-20 <3 RBC/hpf   Bacteria, UA FEW (A) RARE   Urine-Other MUCOUS PRESENT   CBC     Status: Abnormal   Collection Time: 06/01/14  5:48 PM  Result Value Ref Range   WBC 14.7 (H) 4.0 - 10.5 K/uL   RBC 3.87 3.87 - 5.11 MIL/uL   Hemoglobin 10.5 (L) 12.0 - 15.0 g/dL   HCT 32.5 (L) 36.0 - 46.0 %   MCV 84.0 78.0 - 100.0 fL   MCH 27.1 26.0 - 34.0 pg   MCHC 32.3 30.0 - 36.0 g/dL   RDW 13.8 11.5 - 15.5 %   Platelets 415 (H) 150 - 400 K/uL    Review of Systems  Constitutional: Negative for fever (Non currently; no fever in the last 2 days ).  Gastrointestinal: Positive for abdominal pain (Lower abdominal cramping ). Negative for nausea and vomiting.  Genitourinary: Negative for dysuria, urgency, frequency and hematuria.   Physical Exam   Blood pressure 127/82, pulse 102, temperature 98.5 F (36.9 C), temperature source Oral, resp. rate 16, last menstrual period 05/10/2014, SpO2 100 %, not currently breastfeeding.  Physical Exam  Constitutional: She is oriented to person, place, and time. She appears well-developed and well-nourished. No distress.  HENT:  Head: Normocephalic.  Neck: Neck supple.  Cardiovascular: Normal rate and normal heart sounds.   Respiratory: Effort normal and breath sounds normal.  GI: Soft. There is generalized tenderness.  Genitourinary:  Speculum exam: Vagina - Small amount of thin, watery brown  discharge, no odor No bright red blood in vaginal canal.  Chaperone present for exam.   Neurological: She is alert and oriented to person, place, and time.  Skin: Skin is warm. She is not diaphoretic.  Psychiatric: Her behavior is normal.    MAU Course  Procedures  None  MDM CBC Dr. Helane Rima at bedside to see the patient. Urine culture   Assessment and Plan   A:  1. Leukocytosis (leucocytosis); negative fever   2. Postoperative vaginal bleeding likely vaginal cuff hematoma     P:  Discharge home in stable condition Follow up with Dr. Radene Knee as scheduled Return to MAU with any increased pain, bleeding, fever  Darrelyn Hillock Rasch, NP 06/01/2014 8:59 PM

## 2014-06-01 NOTE — MAU Note (Signed)
Patient states she had a LAVH on 12-10. States she started having bleeding yesterday going from bright red to dark reddish brown. Having slight lower back pain for 2 days.

## 2014-06-02 LAB — URINE CULTURE
Colony Count: 30000
SPECIAL REQUESTS: NORMAL

## 2014-09-10 ENCOUNTER — Ambulatory Visit
Admit: 2014-09-10 | Disposition: A | Discharge status: Home / Self Care | Payer: Self-pay | Attending: Family Medicine | Admitting: Family Medicine

## 2014-09-27 ENCOUNTER — Ambulatory Visit
Admit: 2014-09-27 | Disposition: A | Discharge status: Home / Self Care | Payer: Self-pay | Attending: Family Medicine | Admitting: Family Medicine

## 2014-09-27 LAB — RAPID INFLUENZA A&B ANTIGENS

## 2014-09-30 ENCOUNTER — Other Ambulatory Visit: Payer: Self-pay | Admitting: Obstetrics and Gynecology

## 2014-10-05 LAB — CYTOLOGY - PAP

## 2015-06-22 ENCOUNTER — Encounter: Payer: Self-pay | Admitting: Emergency Medicine

## 2015-06-22 ENCOUNTER — Ambulatory Visit
Admission: EM | Admit: 2015-06-22 | Discharge: 2015-06-22 | Disposition: A | Discharge status: Home / Self Care | Payer: BC Managed Care – PPO | Attending: Family Medicine | Admitting: Family Medicine

## 2015-06-22 DIAGNOSIS — H6503 Acute serous otitis media, bilateral: Secondary | ICD-10-CM

## 2015-06-22 MED ORDER — AMOXICILLIN 875 MG PO TABS: 875.0000 mg | ORAL_TABLET | Freq: Two times a day (BID) | ORAL | Status: DC

## 2015-06-22 NOTE — ED Provider Notes (Signed)
CSN: [id number]     Arrival date & time 06/22/15  1710 History   First MD Initiated Contact with Patient 06/22/15 1810     Chief Complaint  Patient presents with  . Otalgia   (Consider location/radiation/quality/duration/timing/severity/associated sxs/prior Treatment) Patient is a 35 y.o. female presenting with ear pain. The history is provided by the patient.  Otalgia Location:  Bilateral Behind ear:  No abnormality Quality:  Pressure and aching Severity:  Mild Onset quality:  Sudden Duration:  3 days Timing:  Constant Progression:  Worsening Chronicity:  New Context: not direct blow, not elevation change, not foreign body in ear and no water in ear   Associated symptoms: congestion, cough, fever and rhinorrhea   Associated symptoms: no ear discharge, no hearing loss, no rash, no tinnitus and no vomiting     Past Medical History  Diagnosis Date  . Umbilical hernia [id number]  . Arthritis     right shoulder, no med  . Anemia    Past Surgical History  Procedure Laterality Date  . Cesarean section  11/02/2006  . Wisdom tooth extraction    . Cesarean section N/A 08/04/2013    Procedure: CESAREAN SECTION;  Surgeon: Darlyn Chamber, MD;  Location: [facility];  Service: Obstetrics;  Laterality: N/A;  Repeat edc 08/09/13  . Cervical cone biopsy    . Umbilical hernia repair N/A 03/19/2014    Procedure: UMBILICAL HERNIA REPAIR WITH MESH;  Surgeon: Erroll Luna, MD;  Location: [place];  Service: General;  Laterality: N/A;  . Insertion of mesh N/A 03/19/2014    Procedure: INSERTION OF MESH;  Surgeon: Erroll Luna, MD;  Location: [place];  Service: General;  Laterality: N/A;  . Laparoscopic assisted vaginal hysterectomy N/A 05/20/2014    Procedure: LAPAROSCOPIC ASSISTED VAGINAL HYSTERECTOMY;  Surgeon: Darlyn Chamber, MD;  Location: [facility];  Service: Gynecology;  Laterality: N/A;  . Abdominal hysterectomy     History reviewed. No pertinent family  history. Social History  Substance Use Topics  . Smoking status: Former Smoker -- 0.10 packs/day for 2 years    Types: Cigarettes    Quit date: 06/11/2000  . Smokeless tobacco: Never Used  . Alcohol Use: No     Comment: rarely - none since 2013   OB History    Gravida Para Term Preterm AB TAB SAB Ectopic Multiple Living   3 2 2  1  1   2      Review of Systems  Constitutional: Positive for fever.  HENT: Positive for congestion, ear pain and rhinorrhea. Negative for ear discharge, hearing loss and tinnitus.   Respiratory: Positive for cough.   Gastrointestinal: Negative for vomiting.  Skin: Negative for rash.    Allergies  Review of patient's allergies indicates no known allergies.  Home Medications   Prior to Admission medications   Medication Sig Start Date End Date Taking? Authorizing Provider  atorvastatin (LIPITOR) 40 MG tablet Take 40 mg by mouth daily.   Yes Historical Provider, MD  Multiple Vitamin (MULTIVITAMIN) tablet Take 1 tablet by mouth daily.   Yes Historical Provider, MD  acetaminophen (TYLENOL) 500 MG tablet Take 1,000 mg by mouth every 6 (six) hours as needed for headache.    Historical Provider, MD  amoxicillin (AMOXIL) 875 MG tablet Take 1 tablet (875 mg total) by mouth 2 (two) times daily. 06/22/15   Norval Gable, MD  docusate sodium (COLACE) 100 MG capsule Take 100 mg by mouth daily as needed.  Historical Provider, MD  HYDROcodone-acetaminophen (NORCO) 5-325 MG per tablet Take 1-2 tablets by mouth every 6 (six) hours as needed for moderate pain. Patient not taking: Reported on 05/11/2014 03/19/14   Erroll Luna, MD  ibuprofen (ADVIL,MOTRIN) 200 MG tablet Take 600 mg by mouth every 6 (six) hours as needed for headache.     Historical Provider, MD  oxyCODONE-acetaminophen (PERCOCET) 7.5-325 MG per tablet Take 1 tablet by mouth every 4 (four) hours as needed for pain. 05/21/14   Arvella Nigh, MD   Meds Ordered and Administered this Visit  Medications - No  data to display  BP 111/79 mmHg  Pulse 97  Temp(Src) 98.3 F (36.8 C) (Oral)  Resp 16  Ht 5\' 2"  (1.575 m)  Wt 185 lb (83.915 kg)  BMI 33.83 kg/m2  SpO2 100%  LMP 05/10/2014 (Exact Date) No data found.   Physical Exam  Constitutional: She appears well-developed and well-nourished. No distress.  HENT:  Head: Normocephalic and atraumatic.  Right Ear: External ear and ear canal normal. Tympanic membrane is erythematous and bulging. A middle ear effusion is present.  Left Ear: External ear and ear canal normal. Tympanic membrane is erythematous and bulging. A middle ear effusion is present.  Nose: No nose lacerations, sinus tenderness, nasal deformity, septal deviation or nasal septal hematoma. No epistaxis.  No foreign bodies.  Mouth/Throat: Uvula is midline and mucous membranes are normal. Posterior oropharyngeal erythema present. No oropharyngeal exudate, posterior oropharyngeal edema or tonsillar abscesses.  Eyes: Conjunctivae and EOM are normal. Pupils are equal, round, and reactive to light. Right eye exhibits no discharge. Left eye exhibits no discharge. No scleral icterus.  Neck: Normal range of motion. Neck supple. No thyromegaly present.  Cardiovascular: Normal rate, regular rhythm and normal heart sounds.   Pulmonary/Chest: Effort normal and breath sounds normal. No respiratory distress. She has no wheezes. She has no rales.  Lymphadenopathy:    She has no cervical adenopathy.  Skin: No rash noted. She is not diaphoretic.  Nursing note and vitals reviewed.   ED Course  Procedures (including critical care time)  Labs Review Labs Reviewed - No data to display  Imaging Review No results found.   Visual Acuity Review  Right Eye Distance:   Left Eye Distance:   Bilateral Distance:    Right Eye Near:   Left Eye Near:    Bilateral Near:         MDM   1. Bilateral acute serous otitis media, recurrence not specified    Discharge Medication List as of  06/22/2015  6:33 PM    START taking these medications   Details  amoxicillin (AMOXIL) 875 MG tablet Take 1 tablet (875 mg total) by mouth 2 (two) times daily., Starting 06/22/2015, Until Discontinued, Normal       1.  diagnosis reviewed with patient 2. rx as per orders above; reviewed possible side effects, interactions, risks and benefits  3. Recommend supportive treatment with otc analgesics, decongestants prn 4. Follow-up prn if symptoms worsen or don't improve    Norval Gable, MD 06/22/15 [phone]

## 2015-06-22 NOTE — ED Notes (Signed)
Patient c/o pain in both ears since Monday.

## 2019-08-19 ENCOUNTER — Other Ambulatory Visit: Payer: Self-pay | Admitting: Obstetrics and Gynecology

## 2019-08-19 DIAGNOSIS — R928 Other abnormal and inconclusive findings on diagnostic imaging of breast: Secondary | ICD-10-CM

## 2019-08-25 ENCOUNTER — Other Ambulatory Visit: Payer: Self-pay | Admitting: Obstetrics and Gynecology

## 2019-08-25 ENCOUNTER — Ambulatory Visit
Admission: RE | Admit: 2019-08-25 | Discharge: 2019-08-25 | Disposition: A | Discharge status: Home / Self Care | Payer: BC Managed Care – PPO | Source: Ambulatory Visit | Attending: Obstetrics and Gynecology | Admitting: Obstetrics and Gynecology

## 2019-08-25 ENCOUNTER — Other Ambulatory Visit: Payer: Self-pay

## 2019-08-25 DIAGNOSIS — R928 Other abnormal and inconclusive findings on diagnostic imaging of breast: Secondary | ICD-10-CM

## 2019-08-25 DIAGNOSIS — N6001 Solitary cyst of right breast: Secondary | ICD-10-CM

## 2019-08-31 ENCOUNTER — Other Ambulatory Visit: Payer: Self-pay

## 2019-08-31 ENCOUNTER — Ambulatory Visit
Admission: RE | Admit: 2019-08-31 | Discharge: 2019-08-31 | Disposition: A | Discharge status: Home / Self Care | Payer: BC Managed Care – PPO | Source: Ambulatory Visit | Attending: Obstetrics and Gynecology | Admitting: Obstetrics and Gynecology

## 2019-08-31 DIAGNOSIS — N6001 Solitary cyst of right breast: Secondary | ICD-10-CM

## 2020-09-13 NOTE — H&P (Signed)
Patient name Latoya Rivers, Latoya Rivers DICTATION# [id number] CSN# [id number]  Arvella Nigh, MD 09/13/2020 4:52 AM

## 2020-09-14 ENCOUNTER — Other Ambulatory Visit: Payer: Self-pay

## 2020-09-14 ENCOUNTER — Encounter (HOSPITAL_BASED_OUTPATIENT_CLINIC_OR_DEPARTMENT_OTHER): Payer: Self-pay | Admitting: Obstetrics and Gynecology

## 2020-09-14 NOTE — Progress Notes (Signed)
Spoke w/ via phone for pre-op interview--- PT Lab needs dos---- CBC, CMP, T&S              Lab results------ no COVID test ------ 09-16-2020 @ 1330 Arrive at ------- 0530 on 09-19-2020 NPO after MN NO Solid Food.  Clear liquids from MN until--- 0430 Med rec completed Medications to take morning of surgery ----- NONE Diabetic medication ----- n/a Patient instructed to bring photo id and insurance card day of surgery Patient aware to have Driver (ride ) / caregiver    for 24 hours after surgery -- husband, Herbie Baltimore Patient Special Instructions ----- n/a Pre-Op special Istructions ----- n/a Patient verbalized understanding of instructions that were given at this phone interview. Patient denies shortness of breath, chest pain, fever, cough at this phone interview.

## 2020-09-14 NOTE — H&P (Signed)
NAME: Latoya Rivers, Latoya Rivers MEDICAL RECORD NO: [id number] ACCOUNT NO: [id number] DATE OF BIRTH: [birth year] PHYSICIAN: Darlyn Chamber, MD  History and Physical   DATE OF ADMISSION: 09/19/2020  Date of surgery 09/19/2020 at [facility], outpatient.  HISTORY OF PRESENT ILLNESS:  The patient is a 40 year old gravida 2, para 2 female who presents for laparoscopy.  In 2015, she had a laparoscopic-assisted vaginal hysterectomy for management of persistent cervical dysplasia.  She did have a vaginal cuff  granulation tissue that was treated.  Over the past year, she has had increasing pain with intercourse during deep penetration.  This has become somewhat limiting.  Pelvic ultrasounds have been unremarkable.  She now presents for laparoscopic evaluation  for management.  ALLERGIES:  She has no known drug allergies.  MEDICATIONS:  She is on lovastatin 20 mg daily.  She is on methylphenidate extended release 36 mg 1 tablet daily.  PAST MEDICAL HISTORY:  She does have a history of abnormal cervical cytology.  She is being managed for hypercholesterolemia.  PAST SURGICAL HISTORY:  She has as noted above laparoscopic-assisted vaginal hysterectomy.  She has also had a previous conization of the cervix.  She had a hernia repair with mesh.  She has had previous hysteroscopies.  She has had previous cesarean  sections.  SOCIAL HISTORY:  No tobacco or alcohol use.  FAMILY HISTORY:  Noncontributory.  REVIEW OF SYSTEMS:  Noncontributory.  PHYSICAL EXAMINATION:  VITAL SIGNS:  The patient is afebrile with stable vital signs.  HEENT:  The patient is normocephalic.  Pupils equal, round, react to light and accommodation.  Extraocular movements are intact.  Sclerae and conjunctiva are clear.  Oropharynx clear. NECK:  Without thyromegaly. BREASTS:  Not examined. LUNGS:  Clear. CARDIOVASCULAR:  Regular rhythm and rate without murmurs or gallops. ABDOMEN:  Benign.  No masses, organomegaly, or tenderness.  On  pelvic normal external genitalia.  Vaginal mucosa was clear.  Cuff intact.  Bimanual exam unremarkable, some tenderness.  No masses are appreciated. EXTREMITIES:  Trace edema. NEUROLOGIC:  Grossly within normal limits.  IMPRESSION:   1.  Deep dyspareunia, rule out pelvic source. 2.  Previous laparoscopic-assisted vaginal hysterectomy.  PLAN:  The patient will undergo diagnostic laparoscopy for evaluation.  The risk of surgery have been discussed including the risk of infection.  Risk of hemorrhage that could require transfusion with the risk of AIDS, hepatitis.  Risk of injury to  adjacent organs such as bladder, bowel or ureters that could require further exploratory surgery.  Risk of deep vein thrombosis and pulmonary embolus.  The patient expressed understanding of the indications and risks.   Elián.Darby D: 09/13/2020 4:48:18 am T: 09/13/2020 9:49:00 am  JOB: [id number]/ [id number]

## 2020-09-16 ENCOUNTER — Other Ambulatory Visit (HOSPITAL_COMMUNITY)
Admission: RE | Admit: 2020-09-16 | Discharge: 2020-09-16 | Disposition: A | Discharge status: Home / Self Care | Payer: BC Managed Care – PPO | Source: Ambulatory Visit | Attending: Obstetrics and Gynecology | Admitting: Obstetrics and Gynecology

## 2020-09-16 DIAGNOSIS — Z20822 Contact with and (suspected) exposure to covid-19: Secondary | ICD-10-CM | POA: Insufficient documentation

## 2020-09-16 DIAGNOSIS — N941 Unspecified dyspareunia: Secondary | ICD-10-CM | POA: Diagnosis present

## 2020-09-16 DIAGNOSIS — Z01812 Encounter for preprocedural laboratory examination: Secondary | ICD-10-CM | POA: Insufficient documentation

## 2020-09-16 DIAGNOSIS — N9412 Deep dyspareunia: Secondary | ICD-10-CM | POA: Diagnosis not present

## 2020-09-16 DIAGNOSIS — N736 Female pelvic peritoneal adhesions (postinfective): Secondary | ICD-10-CM | POA: Diagnosis not present

## 2020-09-17 LAB — SARS CORONAVIRUS 2 (TAT 6-24 HRS): SARS Coronavirus 2: NEGATIVE

## 2020-09-18 NOTE — Anesthesia Preprocedure Evaluation (Addendum)
Anesthesia Evaluation  Patient identified by MRN, date of birth, ID band Patient awake    Reviewed: Allergy & Precautions, NPO status , Patient's Chart, lab work & pertinent test results  Airway Mallampati: III  TM Distance: >3 FB Neck ROM: Full    Dental  (+) Dental Advisory Given,    Pulmonary former smoker,    breath sounds clear to auscultation       Cardiovascular negative cardio ROS   Rhythm:Regular Rate:Normal     Neuro/Psych  Neuromuscular disease    GI/Hepatic negative GI ROS, Neg liver ROS,   Endo/Other  negative endocrine ROS  Renal/GU negative Renal ROS     Musculoskeletal  (+) Arthritis ,   Abdominal   Peds  Hematology   Anesthesia Other Findings Front tooth chipped, uneven, prior to induction. Advisory given.  Reproductive/Obstetrics                           Anesthesia Physical Anesthesia Plan  ASA: II  Anesthesia Plan: General   Post-op Pain Management:    Induction: Intravenous  PONV Risk Score and Plan: 4 or greater and Scopolamine patch - Pre-op, Midazolam, Dexamethasone, Ondansetron and Treatment may vary due to age or medical condition  Airway Management Planned: Oral ETT  Additional Equipment:   Intra-op Plan:   Post-operative Plan: Extubation in OR  Informed Consent: I have reviewed the patients History and Physical, chart, labs and discussed the procedure including the risks, benefits and alternatives for the proposed anesthesia with the patient or authorized representative who has indicated his/her understanding and acceptance.     Dental advisory given  Plan Discussed with: CRNA  Anesthesia Plan Comments:        Anesthesia Quick Evaluation

## 2020-09-19 ENCOUNTER — Ambulatory Visit (HOSPITAL_BASED_OUTPATIENT_CLINIC_OR_DEPARTMENT_OTHER)
Admission: RE | Admit: 2020-09-19 | Discharge: 2020-09-19 | Disposition: A | Discharge status: Home / Self Care | Payer: BC Managed Care – PPO | Source: Ambulatory Visit | Attending: Obstetrics and Gynecology | Admitting: Obstetrics and Gynecology

## 2020-09-19 ENCOUNTER — Encounter (HOSPITAL_BASED_OUTPATIENT_CLINIC_OR_DEPARTMENT_OTHER): Payer: Self-pay | Admitting: Obstetrics and Gynecology

## 2020-09-19 ENCOUNTER — Ambulatory Visit (HOSPITAL_BASED_OUTPATIENT_CLINIC_OR_DEPARTMENT_OTHER): Payer: BC Managed Care – PPO | Admitting: Anesthesiology

## 2020-09-19 ENCOUNTER — Other Ambulatory Visit: Payer: Self-pay

## 2020-09-19 ENCOUNTER — Encounter (HOSPITAL_BASED_OUTPATIENT_CLINIC_OR_DEPARTMENT_OTHER)
Admission: RE | Disposition: A | Discharge status: Home / Self Care | Payer: Self-pay | Source: Ambulatory Visit | Attending: Obstetrics and Gynecology

## 2020-09-19 DIAGNOSIS — N9412 Deep dyspareunia: Secondary | ICD-10-CM | POA: Diagnosis not present

## 2020-09-19 DIAGNOSIS — Z20822 Contact with and (suspected) exposure to covid-19: Secondary | ICD-10-CM | POA: Insufficient documentation

## 2020-09-19 DIAGNOSIS — N736 Female pelvic peritoneal adhesions (postinfective): Secondary | ICD-10-CM | POA: Insufficient documentation

## 2020-09-19 HISTORY — DX: Carpal tunnel syndrome, bilateral upper limbs: G56.03

## 2020-09-19 HISTORY — DX: Personal history of other benign neoplasm: Z86.018

## 2020-09-19 HISTORY — PX: LAPAROSCOPY: SHX197

## 2020-09-19 HISTORY — DX: Personal history of cervical dysplasia: Z87.410

## 2020-09-19 HISTORY — DX: Personal history of diseases of the blood and blood-forming organs and certain disorders involving the immune mechanism: Z86.2

## 2020-09-19 HISTORY — DX: Attention-deficit hyperactivity disorder, unspecified type: F90.9

## 2020-09-19 HISTORY — DX: Unspecified dyspareunia: N94.10

## 2020-09-19 HISTORY — DX: Pelvic and perineal pain: R10.2

## 2020-09-19 HISTORY — DX: Personal history of gestational diabetes: Z86.32

## 2020-09-19 LAB — COMPREHENSIVE METABOLIC PANEL
ALT: 22 U/L (ref 0–44)
AST: 25 U/L (ref 15–41)
Albumin: 4.2 g/dL (ref 3.5–5.0)
Alkaline Phosphatase: 59 U/L (ref 38–126)
Anion gap: 9 (ref 5–15)
BUN: 17 mg/dL (ref 6–20)
CO2: 22 mmol/L (ref 22–32)
Calcium: 8.9 mg/dL (ref 8.9–10.3)
Chloride: 107 mmol/L (ref 98–111)
Creatinine, Ser: 0.78 mg/dL (ref 0.44–1.00)
GFR, Estimated: >60 mL/min (ref >60)
Glucose, Bld: 105 mg/dL — ABNORMAL HIGH (ref 70–99)
Potassium: 3.7 mmol/L (ref 3.5–5.1)
Sodium: 138 mmol/L (ref 135–145)
Total Bilirubin: 0.4 mg/dL (ref 0.3–1.2)
Total Protein: 6.9 g/dL (ref 6.5–8.1)

## 2020-09-19 LAB — TYPE AND SCREEN
ABO/RH(D): A NEG
Antibody Screen: NEGATIVE

## 2020-09-19 LAB — CBC
HCT: 42.6 % (ref 36.0–46.0)
Hemoglobin: 14.2 g/dL (ref 12.0–15.0)
MCH: 29.6 pg (ref 26.0–34.0)
MCHC: 33.3 g/dL (ref 30.0–36.0)
MCV: 88.8 fL (ref 80.0–100.0)
Platelets: 254 10*3/uL (ref 150–400)
RBC: 4.8 MIL/uL (ref 3.87–5.11)
RDW: 13 % (ref 11.5–15.5)
WBC: 11.2 10*3/uL — ABNORMAL HIGH (ref 4.0–10.5)
nRBC: 0 % (ref 0.0–0.2)

## 2020-09-19 SURGERY — LAPAROSCOPY, DIAGNOSTIC
Anesthesia: General | Site: Abdomen

## 2020-09-19 MED ORDER — CEFAZOLIN SODIUM-DEXTROSE 2-4 GM/100ML-% IV SOLN
INTRAVENOUS | Status: AC
  Filled 2020-09-19: qty 100

## 2020-09-19 MED ORDER — PHENYLEPHRINE 40 MCG/ML (10ML) SYRINGE FOR IV PUSH (FOR BLOOD PRESSURE SUPPORT)
PREFILLED_SYRINGE | INTRAVENOUS | Status: DC | PRN
  Administered 2020-09-19: 120 ug via INTRAVENOUS

## 2020-09-19 MED ORDER — FENTANYL CITRATE (PF) 100 MCG/2ML IJ SOLN
INTRAMUSCULAR | Status: DC | PRN
  Administered 2020-09-19: 100 ug via INTRAVENOUS
  Administered 2020-09-19: 50 ug via INTRAVENOUS

## 2020-09-19 MED ORDER — AMISULPRIDE (ANTIEMETIC) 5 MG/2ML IV SOLN: 10.0000 mg | Freq: Once | INTRAVENOUS | Status: DC | PRN

## 2020-09-19 MED ORDER — ACETAMINOPHEN 500 MG PO TABS
ORAL_TABLET | ORAL | Status: AC
  Filled 2020-09-19: qty 2

## 2020-09-19 MED ORDER — PROPOFOL 10 MG/ML IV BOLUS
INTRAVENOUS | Status: DC | PRN
  Administered 2020-09-19: 200 mg via INTRAVENOUS

## 2020-09-19 MED ORDER — LIDOCAINE HCL 2 % IJ SOLN
INTRAMUSCULAR | Status: AC
  Filled 2020-09-19: qty 20

## 2020-09-19 MED ORDER — MIDAZOLAM HCL 2 MG/2ML IJ SOLN
INTRAMUSCULAR | Status: AC
  Filled 2020-09-19: qty 2

## 2020-09-19 MED ORDER — KETAMINE HCL 50 MG/5ML IJ SOSY
PREFILLED_SYRINGE | INTRAMUSCULAR | Status: AC
  Filled 2020-09-19: qty 5

## 2020-09-19 MED ORDER — ONDANSETRON HCL 4 MG/2ML IJ SOLN
INTRAMUSCULAR | Status: AC
  Filled 2020-09-19: qty 2

## 2020-09-19 MED ORDER — FENTANYL CITRATE (PF) 250 MCG/5ML IJ SOLN
INTRAMUSCULAR | Status: AC
  Filled 2020-09-19: qty 5

## 2020-09-19 MED ORDER — SCOPOLAMINE 1 MG/3DAYS TD PT72
1.0000 | MEDICATED_PATCH | TRANSDERMAL | Status: DC
  Administered 2020-09-19: 1.5 mg via TRANSDERMAL

## 2020-09-19 MED ORDER — LACTATED RINGERS IV SOLN: INTRAVENOUS | Status: DC

## 2020-09-19 MED ORDER — LIDOCAINE 2% (20 MG/ML) 5 ML SYRINGE
INTRAMUSCULAR | Status: AC
  Filled 2020-09-19: qty 5

## 2020-09-19 MED ORDER — KETOROLAC TROMETHAMINE 30 MG/ML IJ SOLN
INTRAMUSCULAR | Status: DC | PRN
  Administered 2020-09-19: 30 mg via INTRAVENOUS

## 2020-09-19 MED ORDER — ONDANSETRON HCL 4 MG/2ML IJ SOLN
INTRAMUSCULAR | Status: DC | PRN
  Administered 2020-09-19: 4 mg via INTRAVENOUS

## 2020-09-19 MED ORDER — PROPOFOL 10 MG/ML IV BOLUS
INTRAVENOUS | Status: AC
  Filled 2020-09-19: qty 20

## 2020-09-19 MED ORDER — DEXAMETHASONE SODIUM PHOSPHATE 10 MG/ML IJ SOLN
INTRAMUSCULAR | Status: DC | PRN
  Administered 2020-09-19: 10 mg via INTRAVENOUS

## 2020-09-19 MED ORDER — BUPIVACAINE HCL 0.25 % IJ SOLN
INTRAMUSCULAR | Status: DC | PRN
  Administered 2020-09-19: 6 mL

## 2020-09-19 MED ORDER — KETAMINE HCL 10 MG/ML IJ SOLN
INTRAMUSCULAR | Status: DC | PRN
  Administered 2020-09-19: 40 mg via INTRAVENOUS

## 2020-09-19 MED ORDER — LIDOCAINE 2% (20 MG/ML) 5 ML SYRINGE
INTRAMUSCULAR | Status: DC | PRN
  Administered 2020-09-19: 80 mg via INTRAVENOUS

## 2020-09-19 MED ORDER — ARTIFICIAL TEARS OPHTHALMIC OINT
TOPICAL_OINTMENT | OPHTHALMIC | Status: DC | PRN
  Administered 2020-09-19: 1 via OPHTHALMIC

## 2020-09-19 MED ORDER — POVIDONE-IODINE 10 % EX SWAB: 2.0000 "application " | Freq: Once | CUTANEOUS | Status: DC

## 2020-09-19 MED ORDER — KETOROLAC TROMETHAMINE 30 MG/ML IJ SOLN
INTRAMUSCULAR | Status: AC
  Filled 2020-09-19: qty 1

## 2020-09-19 MED ORDER — METHYLPREDNISOLONE ACETATE 40 MG/ML IJ SUSP
INTRAMUSCULAR | Status: DC | PRN
  Administered 2020-09-19: 11 mL via INTRAMUSCULAR

## 2020-09-19 MED ORDER — ACETAMINOPHEN 500 MG PO TABS
1000.0000 mg | ORAL_TABLET | Freq: Once | ORAL | Status: AC
  Administered 2020-09-19: 1000 mg via ORAL

## 2020-09-19 MED ORDER — LIDOCAINE HCL (PF) 2 % IJ SOLN
INTRAMUSCULAR | Status: DC | PRN
  Administered 2020-09-19: 1.5 mg/kg/h via INTRADERMAL

## 2020-09-19 MED ORDER — SCOPOLAMINE 1 MG/3DAYS TD PT72
MEDICATED_PATCH | TRANSDERMAL | Status: AC
  Filled 2020-09-19: qty 1

## 2020-09-19 MED ORDER — CEFAZOLIN SODIUM-DEXTROSE 2-4 GM/100ML-% IV SOLN
2.0000 g | INTRAVENOUS | Status: AC
  Administered 2020-09-19: 2 g via INTRAVENOUS

## 2020-09-19 MED ORDER — ROCURONIUM BROMIDE 10 MG/ML (PF) SYRINGE
PREFILLED_SYRINGE | INTRAVENOUS | Status: AC
  Filled 2020-09-19: qty 10

## 2020-09-19 MED ORDER — SODIUM CHLORIDE 0.9 % IR SOLN
Status: DC | PRN
  Administered 2020-09-19: 300 mL

## 2020-09-19 MED ORDER — MIDAZOLAM HCL 5 MG/5ML IJ SOLN
INTRAMUSCULAR | Status: DC | PRN
  Administered 2020-09-19: 2 mg via INTRAVENOUS

## 2020-09-19 MED ORDER — ARTIFICIAL TEARS OPHTHALMIC OINT
TOPICAL_OINTMENT | OPHTHALMIC | Status: AC
  Filled 2020-09-19: qty 3.5

## 2020-09-19 MED ORDER — SUGAMMADEX SODIUM 200 MG/2ML IV SOLN
INTRAVENOUS | Status: DC | PRN
  Administered 2020-09-19: 200 mg via INTRAVENOUS

## 2020-09-19 MED ORDER — ROCURONIUM BROMIDE 10 MG/ML (PF) SYRINGE
PREFILLED_SYRINGE | INTRAVENOUS | Status: DC | PRN
  Administered 2020-09-19: 50 mg via INTRAVENOUS

## 2020-09-19 MED ORDER — PHENYLEPHRINE 40 MCG/ML (10ML) SYRINGE FOR IV PUSH (FOR BLOOD PRESSURE SUPPORT)
PREFILLED_SYRINGE | INTRAVENOUS | Status: AC
  Filled 2020-09-19: qty 10

## 2020-09-19 MED ORDER — FENTANYL CITRATE (PF) 100 MCG/2ML IJ SOLN: 25.0000 ug | INTRAMUSCULAR | Status: DC | PRN

## 2020-09-19 MED ORDER — DEXAMETHASONE SODIUM PHOSPHATE 10 MG/ML IJ SOLN
INTRAMUSCULAR | Status: AC
  Filled 2020-09-19: qty 1

## 2020-09-19 SURGICAL SUPPLY — 57 items
ADH SKN CLS APL DERMABOND .7 (GAUZE/BANDAGES/DRESSINGS) ×1
APL SWBSTK 6 STRL LF DISP (MISCELLANEOUS)
APPLICATOR COTTON TIP 6 STRL (MISCELLANEOUS) IMPLANT
APPLICATOR COTTON TIP 6IN STRL (MISCELLANEOUS) IMPLANT
BAG RETRIEVAL 10 (BASKET)
CABLE HIGH FREQUENCY MONO STRZ (ELECTRODE) ×1 IMPLANT
CANISTER SUCT 1200ML W/VALVE (MISCELLANEOUS) IMPLANT
CANISTER SUCT 3000ML PPV (MISCELLANEOUS) IMPLANT
CATH ROBINSON RED A/P 16FR (CATHETERS) ×2 IMPLANT
COVER MAYO STAND STRL (DRAPES) ×2 IMPLANT
COVER WAND RF STERILE (DRAPES) ×2 IMPLANT
DECANTER SPIKE VIAL GLASS SM (MISCELLANEOUS) ×1 IMPLANT
DERMABOND ADVANCED (GAUZE/BANDAGES/DRESSINGS) ×1
DERMABOND ADVANCED .7 DNX12 (GAUZE/BANDAGES/DRESSINGS) ×1 IMPLANT
DRSG COVADERM PLUS 2X2 (GAUZE/BANDAGES/DRESSINGS) ×2 IMPLANT
DURAPREP 26ML APPLICATOR (WOUND CARE) ×2 IMPLANT
GAUZE 4X4 16PLY RFD (DISPOSABLE) ×1 IMPLANT
GLOVE SURG ENC MOIS LTX SZ6.5 (GLOVE) ×1 IMPLANT
GLOVE SURG ENC MOIS LTX SZ7 (GLOVE) ×5 IMPLANT
GLOVE SURG UNDER POLY LF SZ6.5 (GLOVE) ×1 IMPLANT
GLOVE SURG UNDER POLY LF SZ7 (GLOVE) ×1 IMPLANT
GOWN STRL REUS W/ TWL LRG LVL3 (GOWN DISPOSABLE) IMPLANT
GOWN STRL REUS W/TWL LRG LVL3 (GOWN DISPOSABLE) ×2
GOWN STRL REUS W/TWL XL LVL3 (GOWN DISPOSABLE) ×3 IMPLANT
IV NS IRRIG 3000ML ARTHROMATIC (IV SOLUTION) ×1 IMPLANT
KIT TURNOVER CYSTO (KITS) ×2 IMPLANT
MANIFOLD NEPTUNE II (INSTRUMENTS) ×1 IMPLANT
NDL SAFETY ECLIPSE 18X1.5 (NEEDLE) IMPLANT
NDL SPNL 22GX3.5 QUINCKE BK (NEEDLE) IMPLANT
NEEDLE HYPO 18GX1.5 SHARP (NEEDLE) ×2
NEEDLE INSUFFLATION 120MM (ENDOMECHANICALS) ×1 IMPLANT
NEEDLE SPNL 22GX3.5 QUINCKE BK (NEEDLE) ×2 IMPLANT
NS IRRIG 500ML POUR BTL (IV SOLUTION) ×2 IMPLANT
PACK LAPAROSCOPY BASIN (CUSTOM PROCEDURE TRAY) ×2 IMPLANT
PAD OB MATERNITY 4.3X12.25 (PERSONAL CARE ITEMS) ×2 IMPLANT
PAD PREP 24X48 CUFFED NSTRL (MISCELLANEOUS) ×2 IMPLANT
SCISSORS LAP 5X35 DISP (ENDOMECHANICALS) IMPLANT
SCISSORS LAP 5X45 EPIX DISP (ENDOMECHANICALS) ×1 IMPLANT
SEALER TISSUE G2 CVD JAW 45CM (ENDOMECHANICALS) IMPLANT
SET IRRIG Y TYPE TUR BLADDER L (SET/KITS/TRAYS/PACK) IMPLANT
SET SUCTION IRRIG HYDROSURG (IRRIGATION / IRRIGATOR) IMPLANT
SET TUBE SMOKE EVAC HIGH FLOW (TUBING) ×2 IMPLANT
SUT VIC AB 3-0 PS2 18 (SUTURE)
SUT VIC AB 3-0 PS2 18XBRD (SUTURE) ×1 IMPLANT
SUT VIC AB 4-0 PS2 18 (SUTURE) ×1 IMPLANT
SUT VICRYL 0 ENDOLOOP (SUTURE) IMPLANT
SUT VICRYL 0 UR6 27IN ABS (SUTURE) ×1 IMPLANT
SYR 20ML LL LF (SYRINGE) ×1 IMPLANT
SYR 5ML LL (SYRINGE) ×1 IMPLANT
SYS BAG RETRIEVAL 10MM (BASKET)
SYSTEM BAG RETRIEVAL 10MM (BASKET) IMPLANT
TOWEL OR 17X26 10 PK STRL BLUE (TOWEL DISPOSABLE) ×4 IMPLANT
TROCAR BALLN 12MMX100 BLUNT (TROCAR) ×1 IMPLANT
TROCAR OPTI TIP 5M 100M (ENDOMECHANICALS) ×2 IMPLANT
TROCAR XCEL DIL TIP R 11M (ENDOMECHANICALS) IMPLANT
WARMER LAPAROSCOPE (MISCELLANEOUS) ×2 IMPLANT
WATER STERILE IRR 500ML POUR (IV SOLUTION) ×1 IMPLANT

## 2020-09-19 NOTE — Discharge Instructions (Signed)
No Tylenol containing products until 12:00 pm today.  DISCHARGE INSTRUCTIONS: Laparoscopy  The following instructions have been prepared to help you care for yourself upon your return home today.  Wound care: Marland Kitchen Do not get the incision wet for the first 24 hours. The incision should be kept clean and dry. . The Band-Aids or dressings may be removed the day after surgery. . Should the incision become sore, red, and swollen after the first week, check with your doctor.  Personal hygiene: . Shower the day after your procedure.  Activity and limitations: . Do NOT drive or operate any equipment today. . Do NOT lift anything more than 15 pounds for 2-3 weeks after surgery. . Do NOT rest in bed all day. . Walking is encouraged. Walk each day, starting slowly with 5-minute walks 3 or 4 times a day. Slowly increase the length of your walks. . Walk up and down stairs slowly. . Do NOT do strenuous activities, such as golfing, playing tennis, bowling, running, biking, weight lifting, gardening, mowing, or vacuuming for 2-4 weeks. Ask your doctor when it is okay to start.  Diet: Eat a light meal as desired this evening. You may resume your usual diet tomorrow.  Return to work: This is dependent on the type of work you do. For the most part you can return to a desk job within a week of surgery. If you are more active at work, please discuss this with your doctor.  What to expect after your surgery: You may have a slight burning sensation when you urinate on the first day. You may have a very small amount of blood in the urine. Expect to have a small amount of vaginal discharge/light bleeding for 1-2 weeks. It is not unusual to have abdominal soreness and bruising for up to 2 weeks. You may be tired and need more rest for about 1 week. You may experience shoulder pain for 24-72 hours. Lying flat in bed may relieve it.  Call your doctor for any of the following: . Develop a fever of 100.4 or greater .  Inability to urinate 6 hours after discharge from hospital . Severe pain not relieved by pain medications . Persistent of heavy bleeding at incision site . Redness or swelling around incision site after a week . Increasing nausea or vomiting  Patient Signature________________________________________ Nurse Signature_________________________________________ Post Anesthesia Home Care Instructions  Activity: Get plenty of rest for the remainder of the day. A responsible individual must stay with you for 24 hours following the procedure.  For the next 24 hours, DO NOT: -Drive a car -Paediatric nurse -Drink alcoholic beverages -Take any medication unless instructed by your physician -Make any legal decisions or sign important papers.  Meals: Start with liquid foods such as gelatin or soup. Progress to regular foods as tolerated. Avoid greasy, spicy, heavy foods. If nausea and/or vomiting occur, drink only clear liquids until the nausea and/or vomiting subsides. Call your physician if vomiting continues.  Special Instructions/Symptoms: Your throat may feel dry or sore from the anesthesia or the breathing tube placed in your throat during surgery. If this causes discomfort, gargle with warm salt water. The discomfort should disappear within 24 hours.  If you had a scopolamine patch placed behind your ear for the management of post- operative nausea and/or vomiting:  1. The medication in the patch is effective for 72 hours, after which it should be removed.  Wrap patch in a tissue and discard in the trash. Wash hands thoroughly with  soap and water. Remove patch 09/22/20. 2. You may remove the patch earlier than 72 hours if you experience unpleasant side effects which may include dry mouth, dizziness or visual disturbances. 3. Avoid touching the patch. Wash your hands with soap and water after contact with the patch.

## 2020-09-19 NOTE — Anesthesia Procedure Notes (Signed)
Procedure Name: Intubation Date/Time: 09/19/2020 7:35 AM Performed by: Rogers Blocker, CRNA Pre-anesthesia Checklist: Patient identified, Emergency Drugs available, Suction available and Patient being monitored Patient Re-evaluated:Patient Re-evaluated prior to induction Oxygen Delivery Method: Circle System Utilized Preoxygenation: Pre-oxygenation with 100% oxygen Induction Type: IV induction Ventilation: Mask ventilation without difficulty Laryngoscope Size: Mac and 3 Grade View: Grade I Tube type: Oral Number of attempts: 1 Airway Equipment and Method: Stylet Placement Confirmation: ETT inserted through vocal cords under direct vision,  positive ETCO2 and breath sounds checked- equal and bilateral Secured at: 22 cm Tube secured with: Tape Dental Injury: Teeth and Oropharynx as per pre-operative assessment

## 2020-09-19 NOTE — Hospital Course (Signed)
  History and physical exam unchanged 

## 2020-09-19 NOTE — Brief Op Note (Signed)
09/19/2020  8:38 AM  PATIENT:  Latoya Rivers  40 y.o. female  PRE-OPERATIVE DIAGNOSIS:  DYSPAREUNIA  POST-OPERATIVE DIAGNOSIS:  DYSPAREUNIA  PROCEDURE:  Procedure(s): LAPAROSCOPY DIAGNOSTIC AND LYSIS OF ADEHESIONS (N/A)  SURGEON:  Surgeon(s) and Role:    * Arvella Nigh, MD - Primary  PHYSICIAN ASSISTANT:   ASSISTANTS: none   ANESTHESIA:   general  EBL:  100   BLOOD ADMINISTERED:none  DRAINS: none   LOCAL MEDICATIONS USED:  MARCAINE     SPECIMEN:  No Specimen  DISPOSITION OF SPECIMEN:  N/A  COUNTS:  YES  TOURNIQUET:  * No tourniquets in log *  DICTATION: .Other Dictation: Dictation Number [id number]  PLAN OF CARE: Discharge to home after PACU  PATIENT DISPOSITION:  PACU - hemodynamically stable.   Delay start of Pharmacological VTE agent (>24hrs) due to surgical blood loss or risk of bleeding: not applicable

## 2020-09-19 NOTE — Op Note (Signed)
NAME: Latoya Rivers, WIECZOREK MEDICAL RECORD NO: [id number] ACCOUNT NO: [id number] DATE OF BIRTH: 04/05/1981 FACILITY: [facility] LOCATION: [facility] PHYSICIAN: Darlyn Chamber, MD  Operative Report   DATE OF PROCEDURE: 09/19/2020  PREOPERATIVE DIAGNOSES:  Dyspareunia.  POSTOPERATIVE DIAGNOSES:  Dyspareunia with addition of pelvic adhesions.  PROCEDURE:  Open laparoscopy with lysis of adhesions and injection of the vaginal cuff.  SURGEON:  Dr. Arvella Nigh.  ANESTHESIA:  General.  ESTIMATED BLOOD LOSS:  Minimal.  PACKS AND DRAINS:  None.  INTRAOPERATIVE BLOOD REPLACED:  None.  COMPLICATIONS:  None.  INDICATIONS:  Dictated in history and physical.  DESCRIPTION OF PROCEDURE:  The patient was taken to the OR and placed in supine position.  After satisfactory level of general endotracheal anesthesia obtained, the patient was placed in the dorsal lithotomy position using the Allen stirrups.  The  abdomen was prepped with DuraPrep.  The vagina was cleaned out with Betadine.  Bladder was entered via catheterization.  A sponge on a sponge stick was placed in the vaginal vault.  The patient was then draped in a sterile field.  Subumbilical incision  was made with a knife and extended through the subcutaneous tissue.  Fascia was identified and entered sharply.  Incision in the fascia extended laterally.  Peritoneum was then entered with blunt finger pressure.  There were omental adhesions to the  periumbilical area.  Her open laparoscopic trocar was put in place and secured.  Abdomen was insufflated with carbon dioxide.  Laparoscope was introduced.  There was no evidence of injury to adjacent organ.  Again, the omentum was adherent to the  periumbilical area.  A 5 mm trocar was put in place under direct visualization.  Visualization did reveal some adhesions to the left ovary.  Right ovary was unremarkable.  There were adhesions from the sigmoid colon to the vaginal cuff.  Using the  monopolar  scissors, the adhesions to the left ovary were first taken down.  We had good release of the left ovary.  The adhesions from the sigmoid colon to the vaginal cuff were also taken down.  There was a small amount of adhesions to the sigmoid colon  and the cul-de-sac that we left intact.  We thoroughly irrigated the pelvis.  We had good hemostasis at this point.  We then removed the sponge on the sponge stick from the vagina.  Vaginal cuff was injected with a combination of Marcaine and dexamethasone.  At this point in time, laparoscope was reintroduced.  Visualization revealed no active bleeding.  The vaginal  cuff was free from any adhesions at this point in time.  We thoroughly irrigated the pelvis, removed all irrigation.  No active bleeding was encountered.  Laparoscope was then removed.  Abdomen was desufflated of carbon dioxide.  The open laparoscopic  trocar was removed.  Subumbilical fascia was closed with two figure-of-eights of 0 Vicryl.  Skin was closed with interrupted subcuticular 4-0 Vicryl and Dermabond.  Suprapubic incision was closed with Dermabond.  Sponge, instrument and needle counts were  reported correct by circulating nurse x2.  The patient tolerated the procedure well and was returned to recovery room in good condition.   [email or web address] D: 09/19/2020 8:32:45 am T: 09/19/2020 9:44:00 am  JOB: [id number]/ [id number]

## 2020-09-19 NOTE — Anesthesia Postprocedure Evaluation (Signed)
Anesthesia Post Note  Patient: Latoya Rivers  Procedure(s) Performed: LAPAROSCOPY DIAGNOSTIC AND LYSIS OF ADEHESIONS (N/A Abdomen)     Patient location during evaluation: PACU Anesthesia Type: General Level of consciousness: awake and alert Pain management: pain level controlled Vital Signs Assessment: post-procedure vital signs reviewed and stable Respiratory status: spontaneous breathing, nonlabored ventilation, respiratory function stable and patient connected to nasal cannula oxygen Cardiovascular status: blood pressure returned to baseline and stable Postop Assessment: no apparent nausea or vomiting Anesthetic complications: no   No complications documented.  Last Vitals:  Vitals:   09/19/20 0915 09/19/20 0956  BP: (!) 128/91 122/87  Pulse: 82 83  Resp: 12 16  Temp:  36.6 C  SpO2: 96% 96%    Last Pain:  Vitals:   09/19/20 0956  TempSrc: Oral  PainSc: 3                  Tiajuana Amass

## 2020-09-19 NOTE — Transfer of Care (Signed)
Immediate Anesthesia Transfer of Care Note  Patient: Latoya Rivers  Procedure(s) Performed: LAPAROSCOPY DIAGNOSTIC AND LYSIS OF ADEHESIONS (N/A Abdomen)  Patient Location: PACU  Anesthesia Type:General  Level of Consciousness: awake, alert , oriented and patient cooperative  Airway & Oxygen Therapy: Patient Spontanous Breathing and Patient connected to face mask oxygen  Post-op Assessment: Report given to RN and Post -op Vital signs reviewed and stable  Post vital signs: Reviewed and stable  Last Vitals:  Vitals Value Taken Time  BP 143/98 09/19/20 0839  Temp    Pulse 96 09/19/20 0842  Resp 21 09/19/20 0842  SpO2 100 % 09/19/20 0842  Vitals shown include unvalidated device data.  Last Pain:  Vitals:   09/19/20 0556  TempSrc: Oral  PainSc: 0-No pain      Patients Stated Pain Goal: 3 (35/46/56 8127)  Complications: No complications documented.

## 2020-09-21 ENCOUNTER — Encounter (HOSPITAL_BASED_OUTPATIENT_CLINIC_OR_DEPARTMENT_OTHER): Payer: Self-pay | Admitting: Obstetrics and Gynecology

## 2020-11-04 IMAGING — US US ASPIRATION RIGHT BREAST
1 series · 2 of 2 positions shown · non-contrast
Comparison: Previous exams.

CLINICAL DATA: Patient presents for cyst aspiration a probable
complicated cyst in the right breast at 9:30 o'clock.

EXAM:
ULTRASOUND GUIDED RIGHT BREAST CYST ASPIRATION

[Series 1: us aspiration right breast · 0.07mm/px · 2 of 2 slices shown]
[im 1/2]
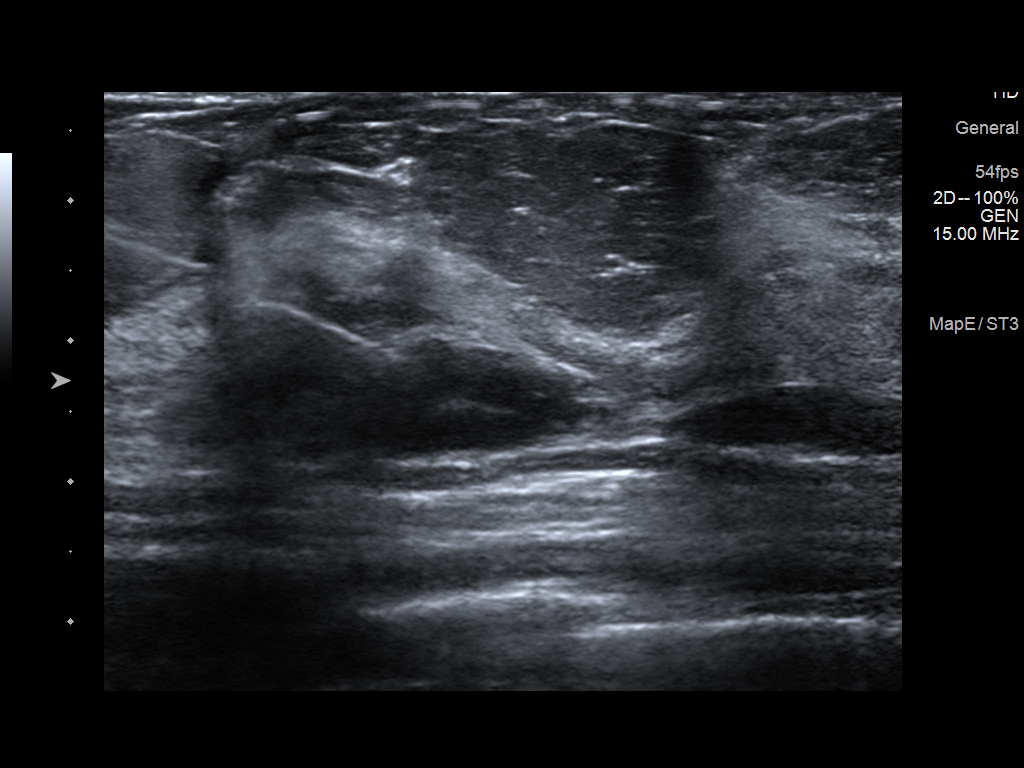
[im 2/2]
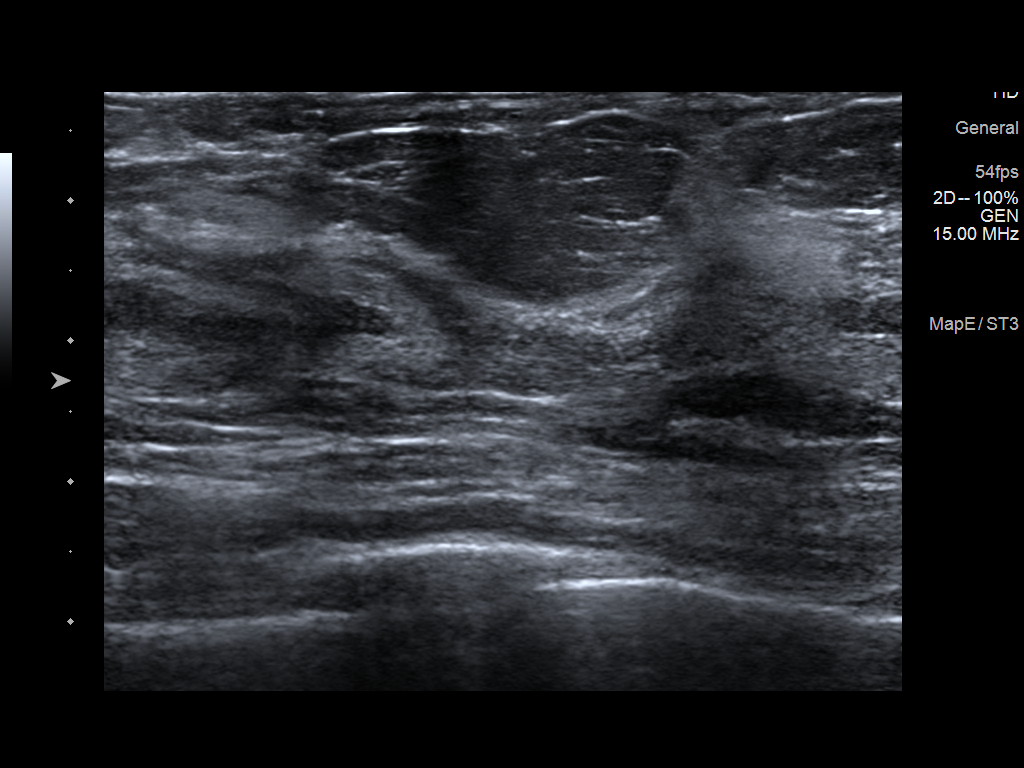

[2 of 2 positions shown; findings below may reference images not displayed]

PROCEDURE:
Using sterile technique, 1% lidocaine, under direct ultrasound
visualization, needle aspiration of the presumed cyst at 9:30
o'clock, 3 cm from the nipple was performed. The cyst was aspirated
yielding 9 mL of straw-colored fluid, completely collapsing.
IMPRESSION: Ultrasound-guided aspiration of a right breast cyst no apparent
complications.

RECOMMENDATIONS:
Screening mammogram at age 42 unless there are persistent or
intervening clinical concerns. (Code:B4-7-148)

## 2022-11-13 ENCOUNTER — Other Ambulatory Visit: Payer: Self-pay | Admitting: Obstetrics and Gynecology

## 2022-11-13 DIAGNOSIS — R928 Other abnormal and inconclusive findings on diagnostic imaging of breast: Secondary | ICD-10-CM

## 2022-12-03 ENCOUNTER — Ambulatory Visit
Admission: RE | Admit: 2022-12-03 | Discharge: 2022-12-03 | Disposition: A | Discharge status: Home / Self Care | Payer: BC Managed Care – PPO | Source: Ambulatory Visit | Attending: Obstetrics and Gynecology | Admitting: Obstetrics and Gynecology

## 2022-12-03 DIAGNOSIS — R928 Other abnormal and inconclusive findings on diagnostic imaging of breast: Secondary | ICD-10-CM

## 2023-07-08 ENCOUNTER — Other Ambulatory Visit: Payer: Self-pay | Admitting: Obstetrics and Gynecology

## 2023-07-08 DIAGNOSIS — N63 Unspecified lump in unspecified breast: Secondary | ICD-10-CM

## 2023-07-19 ENCOUNTER — Ambulatory Visit
Admission: RE | Admit: 2023-07-19 | Discharge: 2023-07-19 | Disposition: A | Discharge status: Home / Self Care | Payer: 59 | Source: Ambulatory Visit | Attending: Obstetrics and Gynecology | Admitting: Obstetrics and Gynecology

## 2023-07-19 ENCOUNTER — Ambulatory Visit
Admission: RE | Admit: 2023-07-19 | Discharge: 2023-07-19 | Disposition: A | Discharge status: Home / Self Care | Payer: Commercial Managed Care - PPO | Source: Ambulatory Visit | Attending: Obstetrics and Gynecology | Admitting: Obstetrics and Gynecology

## 2023-07-19 ENCOUNTER — Other Ambulatory Visit: Payer: Self-pay | Admitting: Obstetrics and Gynecology

## 2023-07-19 DIAGNOSIS — N63 Unspecified lump in unspecified breast: Secondary | ICD-10-CM

## 2023-07-19 DIAGNOSIS — N6001 Solitary cyst of right breast: Secondary | ICD-10-CM

## 2023-07-23 ENCOUNTER — Ambulatory Visit
Admission: RE | Admit: 2023-07-23 | Discharge: 2023-07-23 | Disposition: A | Discharge status: Home / Self Care | Payer: 59 | Source: Ambulatory Visit | Attending: Obstetrics and Gynecology | Admitting: Obstetrics and Gynecology

## 2023-07-23 DIAGNOSIS — N6001 Solitary cyst of right breast: Secondary | ICD-10-CM
